# Patient Record
Sex: Female | Born: 1980 | Race: White | Hispanic: No | Marital: Married | Smoking: Never smoker
Health system: Southern US, Community
[De-identification: ages and names within clinical notes are randomized; demographics above are authoritative.]

## PROBLEM LIST (undated history)

## (undated) DIAGNOSIS — T8859XA Other complications of anesthesia, initial encounter: Secondary | ICD-10-CM

## (undated) DIAGNOSIS — R519 Headache, unspecified: Secondary | ICD-10-CM

## (undated) DIAGNOSIS — G909 Disorder of the autonomic nervous system, unspecified: Secondary | ICD-10-CM

## (undated) DIAGNOSIS — J45909 Unspecified asthma, uncomplicated: Secondary | ICD-10-CM

## (undated) DIAGNOSIS — Z86718 Personal history of other venous thrombosis and embolism: Secondary | ICD-10-CM

## (undated) DIAGNOSIS — F319 Bipolar disorder, unspecified: Secondary | ICD-10-CM

## (undated) DIAGNOSIS — R51 Headache: Secondary | ICD-10-CM

## (undated) DIAGNOSIS — R002 Palpitations: Secondary | ICD-10-CM

## (undated) DIAGNOSIS — K229 Disease of esophagus, unspecified: Secondary | ICD-10-CM

## (undated) DIAGNOSIS — S069X9A Unspecified intracranial injury with loss of consciousness of unspecified duration, initial encounter: Secondary | ICD-10-CM

## (undated) DIAGNOSIS — R112 Nausea with vomiting, unspecified: Secondary | ICD-10-CM

## (undated) DIAGNOSIS — T4145XA Adverse effect of unspecified anesthetic, initial encounter: Secondary | ICD-10-CM

## (undated) DIAGNOSIS — Z9889 Other specified postprocedural states: Secondary | ICD-10-CM

## (undated) DIAGNOSIS — I951 Orthostatic hypotension: Secondary | ICD-10-CM

## (undated) DIAGNOSIS — M3500 Sicca syndrome, unspecified: Secondary | ICD-10-CM

## (undated) DIAGNOSIS — K224 Dyskinesia of esophagus: Secondary | ICD-10-CM

## (undated) DIAGNOSIS — F445 Conversion disorder with seizures or convulsions: Secondary | ICD-10-CM

## (undated) DIAGNOSIS — G90A Postural orthostatic tachycardia syndrome (POTS): Secondary | ICD-10-CM

## (undated) DIAGNOSIS — G629 Polyneuropathy, unspecified: Secondary | ICD-10-CM

## (undated) DIAGNOSIS — I498 Other specified cardiac arrhythmias: Secondary | ICD-10-CM

## (undated) DIAGNOSIS — R000 Tachycardia, unspecified: Secondary | ICD-10-CM

## (undated) HISTORY — PX: MUSCLE BIOPSY: SHX716

---
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
## 2000-02-11 HISTORY — PX: DILATION AND CURETTAGE, DIAGNOSTIC / THERAPEUTIC: SUR384

## 2001-02-10 DIAGNOSIS — Z86718 Personal history of other venous thrombosis and embolism: Secondary | ICD-10-CM

## 2001-02-10 HISTORY — PX: CHOLECYSTECTOMY: SHX55

## 2001-02-10 HISTORY — DX: Personal history of other venous thrombosis and embolism: Z86.718

## 2006-02-02 ENCOUNTER — Ambulatory Visit: Payer: Self-pay | Admitting: *Deleted

## 2006-02-02 ENCOUNTER — Observation Stay (HOSPITAL_COMMUNITY): Admission: AD | Admit: 2006-02-02 | Discharge: 2006-02-02 | Payer: Self-pay | Admitting: Gynecology

## 2010-02-10 HISTORY — PX: DILITATION & CURRETTAGE/HYSTROSCOPY WITH HYDROTHERMAL ABLATION: SHX5570

## 2011-02-11 DIAGNOSIS — S069X9A Unspecified intracranial injury with loss of consciousness of unspecified duration, initial encounter: Secondary | ICD-10-CM

## 2011-02-11 HISTORY — DX: Unspecified intracranial injury with loss of consciousness of unspecified duration, initial encounter: S06.9X9A

## 2014-08-28 ENCOUNTER — Other Ambulatory Visit: Payer: Self-pay | Admitting: Neurology

## 2014-08-28 DIAGNOSIS — G629 Polyneuropathy, unspecified: Secondary | ICD-10-CM

## 2014-09-01 ENCOUNTER — Ambulatory Visit
Admission: RE | Admit: 2014-09-01 | Discharge: 2014-09-01 | Disposition: A | Discharge status: Home / Self Care | Payer: BLUE CROSS/BLUE SHIELD | Source: Ambulatory Visit | Attending: Neurology | Admitting: Neurology

## 2014-09-01 DIAGNOSIS — G629 Polyneuropathy, unspecified: Secondary | ICD-10-CM

## 2014-09-01 MED ORDER — GADOBENATE DIMEGLUMINE 529 MG/ML IV SOLN
15.0000 mL | Freq: Once | INTRAVENOUS | Status: AC | PRN
  Administered 2014-09-01: 15 mL via INTRAVENOUS

## 2014-10-04 ENCOUNTER — Other Ambulatory Visit: Payer: Self-pay | Admitting: Internal Medicine

## 2014-10-04 DIAGNOSIS — D3500 Benign neoplasm of unspecified adrenal gland: Secondary | ICD-10-CM

## 2014-10-06 ENCOUNTER — Other Ambulatory Visit: Payer: BLUE CROSS/BLUE SHIELD

## 2014-10-08 ENCOUNTER — Ambulatory Visit
Admission: RE | Admit: 2014-10-08 | Discharge: 2014-10-08 | Disposition: A | Discharge status: Home / Self Care | Payer: BLUE CROSS/BLUE SHIELD | Source: Ambulatory Visit | Attending: Internal Medicine | Admitting: Internal Medicine

## 2014-10-08 DIAGNOSIS — D3500 Benign neoplasm of unspecified adrenal gland: Secondary | ICD-10-CM

## 2014-10-08 MED ORDER — GADOBENATE DIMEGLUMINE 529 MG/ML IV SOLN
15.0000 mL | Freq: Once | INTRAVENOUS | Status: AC | PRN
  Administered 2014-10-08: 15 mL via INTRAVENOUS

## 2014-10-27 ENCOUNTER — Other Ambulatory Visit: Payer: Self-pay | Admitting: Internal Medicine

## 2014-10-27 DIAGNOSIS — E232 Diabetes insipidus: Secondary | ICD-10-CM

## 2014-11-18 ENCOUNTER — Ambulatory Visit
Admission: RE | Admit: 2014-11-18 | Discharge: 2014-11-18 | Disposition: A | Discharge status: Home / Self Care | Payer: BLUE CROSS/BLUE SHIELD | Source: Ambulatory Visit | Attending: Internal Medicine | Admitting: Internal Medicine

## 2014-11-18 DIAGNOSIS — E232 Diabetes insipidus: Secondary | ICD-10-CM

## 2014-11-18 MED ORDER — GADOBENATE DIMEGLUMINE 529 MG/ML IV SOLN
7.0000 mL | Freq: Once | INTRAVENOUS | Status: AC | PRN
  Administered 2014-11-18: 7 mL via INTRAVENOUS

## 2014-11-19 ENCOUNTER — Inpatient Hospital Stay: Admission: RE | Admit: 2014-11-19 | Payer: BLUE CROSS/BLUE SHIELD | Source: Ambulatory Visit

## 2015-02-11 HISTORY — PX: ESOPHAGOGASTRODUODENOSCOPY: SHX1529

## 2015-02-11 HISTORY — PX: COLONOSCOPY: SHX174

## 2016-07-03 ENCOUNTER — Ambulatory Visit: Payer: Self-pay | Admitting: Allergy and Immunology

## 2016-09-09 ENCOUNTER — Ambulatory Visit: Payer: Self-pay | Admitting: Surgery

## 2016-09-09 NOTE — H&P (Signed)
History of Present Illness Desiree Evans. Desiree Peyton MD; 09/09/2016 4:35 PM) The patient is a 36 year old female who presents with a complaint of Muscle pain. PCP - Dr. Kennith Maes Reason for referral - needs muscle biopsy  This is a 36 year old female with multiple medical issues who has been undergoing extensive workup over the last several years in search of her chronic muscle pain and weakness. She sees a Garment/textile technologist at [employer] and sees a Dietitian at [employer] [facility] [employer]. 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 A muscle biopsy is required for confirmation. The procedure was originally scheduled at [facility] by Dr. Melynda Ripple but the pathology department there did not feel that they could properly handle the specimen. She is now referred to [place] for the procedure.   Past Surgical History Dalbert Mayotte, [place]; 09/09/2016 3:52 PM) Appendectomy Gallbladder Surgery - Laparoscopic  Diagnostic Studies History Dalbert Mayotte, [place]; 09/09/2016 3:52 PM) Colonoscopy within last year Mammogram never Pap Smear 1-5 years ago  Allergies Dalbert Mayotte, CMA; 09/09/2016 3:59 PM) Latex Avocado Sulfa Drugs Imitrex *Migraine Products Est Estrogens-Methyltest *ESTROGENS* Allergies Reconciled  Medication History Dalbert Mayotte, CMA; 09/09/2016 3:56 PM) Armodafinil (150MG  Tablet, Oral) Active. Bystolic (2.5MG  Tablet, Oral) Active. Gabapentin (400MG  Capsule, Oral) Active. Hydroxychloroquine Sulfate (200MG  Tablet, Oral) Active. Pyridostigmine Bromide (60MG  Tablet, Oral) Active. Midodrine HCl (2.5MG  Tablet, Oral) Active. Armodafinil (250MG  Tablet, Oral) Active. Venlafaxine HCl ER (37.5MG  Capsule ER 24HR, Oral) Active. Pantoprazole Sodium (40MG  Tablet DR, Oral) Active. Vitamin D (Ergocalciferol) (50000UNIT Capsule, Oral) Active. HydrOXYzine HCl (25MG  Tablet, Oral) Active. Medications Reconciled  Social History Dalbert Mayotte, [place];  09/09/2016 3:52 PM) Alcohol use Occasional alcohol use. Caffeine use Carbonated beverages, Coffee, Tea. No drug use Tobacco use Never smoker.  Family History Dalbert Mayotte, [place]; 09/09/2016 3:52 PM) Arthritis Mother. Cervical Cancer Mother.  Pregnancy / Birth History Dalbert Mayotte, [place]; 09/09/2016 3:52 PM) Age at menarche 74 years. Contraceptive History Oral contraceptives. Gravida 5 Irregular periods Length (months) of breastfeeding 7-12 Maternal age 51-25 Para 65  Other Problems Dalbert Mayotte, [place]; 09/09/2016 3:52 PM) Asthma Chest pain Other disease, cancer, significant illness     Review of Systems Dalbert Mayotte CMA; 09/09/2016 3:52 PM) General Present- Fatigue. Not Present- Appetite Loss, Chills, Fever, Night Sweats, Weight Gain and Weight Loss. Skin Not Present- Change in Wart/Mole, Dryness, Hives, Jaundice, New Lesions, Non-Healing Wounds, Rash and Ulcer. HEENT Present- Hoarseness and Visual Disturbances. Not Present- Earache, Hearing Loss, Nose Bleed, Oral Ulcers, Ringing in the Ears, Seasonal Allergies, Sinus Pain, Sore Throat, Wears glasses/contact lenses and Yellow Eyes. Respiratory Present- Wheezing. Not Present- Bloody sputum, Chronic Cough, Difficulty Breathing and Snoring. Breast Not Present- Breast Mass, Breast Pain, Nipple Discharge and Skin Changes. Cardiovascular Present- Difficulty Breathing Lying Down, Leg Cramps, Rapid Heart Rate and Shortness of Breath. Not Present- Chest Pain, Palpitations and Swelling of Extremities. Gastrointestinal Present- Change in Bowel Habits, Difficulty Swallowing, Gets full quickly at meals, Indigestion and Nausea. Not Present- Abdominal Pain, Bloating, Bloody Stool, Chronic diarrhea, Constipation, Excessive gas, Hemorrhoids, Rectal Pain and Vomiting. Female Genitourinary Not Present- Frequency, Nocturia, Painful Urination, Pelvic Pain and Urgency. Musculoskeletal Present- Joint Pain, Joint Stiffness, Muscle Pain  and Muscle Weakness. Not Present- Back Pain and Swelling of Extremities. Neurological Present- Fainting, Headaches, Trouble walking and Weakness. Not Present- Decreased Memory, Numbness, Seizures, Tingling and Tremor. Psychiatric Not Present- Anxiety, Bipolar, Change in Sleep Pattern, Depression, Fearful and Frequent crying. Endocrine Not Present- Cold Intolerance, Excessive Hunger, Hair Changes, Heat Intolerance, Hot flashes  and New Diabetes. Hematology Present- Easy Bruising. Not Present- Blood Thinners, Excessive bleeding, Gland problems, HIV and Persistent Infections.  Vitals Dalbert Mayotte CMA; 09/09/2016 3:59 PM) 09/09/2016 3:59 PM Weight: 181.4 lb Height: 67in Body Surface Area: 1.94 m Body Mass Index: 28.41 kg/m  Temp.: 97.38F  Pulse: 78 (Regular)  BP: 110/72 (Sitting, Left Arm, Standard)      Physical Exam Rodman Key K. Nathaly Dawkins MD; 09/09/2016 4:38 PM)  The physical exam findings are as follows: Note:WDWN in NAD Eyes: Pupils equal, round; sclera anicteric HENT: Oral mucosa moist; good dentition Neck: No masses palpated, no thyromegaly Lungs: CTA bilaterally; normal respiratory effort CV: Regular rate and rhythm; no murmurs; extremities well-perfused with no edema Abd: +bowel sounds, soft, non-tender, no palpable organomegaly; no palpable hernias Skin: Warm, dry; no sign of jaundice Right anterior thigh - no scars; no palpable masses; moderately tender to palpation Psychiatric - alert and oriented x 4; calm mood and affect    Assessment & Plan Rodman Key K. 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 The surgical procedure has been discussed with the patient. Potential risks, benefits, alternative treatments, and expected outcomes have been explained. All of the patient's questions at this time have been answered. The likelihood of reaching the patient's treatment goal is good. The  patient understand the proposed surgical procedure and wishes to proceed. Note:I explained to the patient and her husband that I would only be the technician for this procedure. I will not be any help in interpreting the results. We will forward the results her primary care physician for further treatment. We will recheck her wound after surgery.  The patient is out of town for the next week and a hal and start a new job on August 20. We'll try to schedule this on Monday, August 13th.  Desiree Evans. Georgette Dover, MD, [facility] Surgery  General/ Trauma Surgery  09/09/2016 4:39 PM

## 2016-09-19 ENCOUNTER — Encounter (HOSPITAL_COMMUNITY): Payer: Self-pay | Admitting: *Deleted

## 2016-09-19 MED ORDER — CEFAZOLIN SODIUM-DEXTROSE 2-4 GM/100ML-% IV SOLN
2.0000 g | INTRAVENOUS | Status: AC
  Administered 2016-09-22: 2 g via INTRAVENOUS
  Filled 2016-09-19: qty 100

## 2016-09-19 NOTE — Progress Notes (Signed)
Anesthesia Chart Review:  Pt is a same day work up.   Pt is a 36 year old female scheduled for R thigh muscle biopsy as part of work up for muscular dystrophy on 09/22/2016 with Donnie Mesa, MD  - PCP is Kennith Maes, MD - Cardiologist is Donnamae Jude, MD  - EP cardiologist is Mylinda Latina, MD  - Neurologist is Roque Cash, MD   - Allergist is Ashby Dawes, MD   - Rheumatologist is Evette Cristal, MD   - GI is Laureen Abrahams, PA and Bobbye Morton, MD  (Notes for all providers except PCP are in care everywhere)  PMH includes:  POTS, syncope, frequent falls, hypersomnolence, chronic idiopathic urticaria, Sjogren's syndrome, bipolar disorder, jackhammer esophagus (s/p botox injections)  Medications include: nuvigil, B complex, Zyrtec, Flonase, Neurontin, Atarax, ibuprofen-famotidine, midodrine, mometasone-formoterol, Mestinon, nebivolol, Protonix, MiraLAX, armor thyroid, Effexor, vitamin D, albuterol, Zantac - Mestinon is prescribed by GI for 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 Pt also reported to PAT RN she takes it when her BP is too low.  Pt will hold mestinon DOS if she feels she is able, but will take it if her BP that day is too low.   Labs will be obtained DOS  CXR 06/10/16 (care everywhere):  1. Cardiac silhouette and mediastinal contours are within normal limits. 2. Lungs and pleural spaces and visualized bones are within normal limits  EKG will be obtained DOS  Holter monitor 09/10/15 (care everywhere):  1) The quality of this 48 hour Holter was good. 2) The patient was in sinus rhythm, sinus bradycardia, sinus tachycardia and sinus arrhythmia.  3) Ventricular ectopic activity consisted of multifocal PVCs(0.002% of total beats) (strip 6).  4) Supraventricular ectopic activity consisted of PACs (0.002% of total beats) (strip 8).  5) There were no pauses greater than 2.0 seconds noted.  6) Symptoms of "chest pain, ringing in ears and  headache" correlated with sinus rhythm and sinus tachycardia (strips 5,7, and 9).  If labs and EKG acceptable DOS, I anticipate pt can proceed as scheduled.   Willeen Cass, FNP-BC [facility]/Anesthesiology Phone: [phone] 09/19/2016 3:55 PM

## 2016-09-19 NOTE — Progress Notes (Signed)
Mrs Feuerborn reports that she takes Mestinon for gastric motility and for hypotension.  Patient reports that CBG can be in the 70's when she just take MIdodrine.  I spoke with Willeen Cass, NP and was instructed to tell patient that it is her call, patient said she may not need it, but will take if blood pressure is low.

## 2016-09-22 ENCOUNTER — Ambulatory Visit (HOSPITAL_COMMUNITY): Payer: 59 | Admitting: Emergency Medicine

## 2016-09-22 ENCOUNTER — Ambulatory Visit (HOSPITAL_COMMUNITY)
Admission: RE | Admit: 2016-09-22 | Discharge: 2016-09-22 | Disposition: A | Discharge status: Home / Self Care | Payer: 59 | Source: Ambulatory Visit | Attending: Surgery | Admitting: Surgery

## 2016-09-22 ENCOUNTER — Encounter (HOSPITAL_COMMUNITY)
Admission: RE | Disposition: A | Discharge status: Home / Self Care | Payer: Self-pay | Source: Ambulatory Visit | Attending: Surgery

## 2016-09-22 ENCOUNTER — Encounter (HOSPITAL_COMMUNITY): Payer: Self-pay | Admitting: Surgery

## 2016-09-22 DIAGNOSIS — M3500 Sicca syndrome, unspecified: Secondary | ICD-10-CM | POA: Insufficient documentation

## 2016-09-22 DIAGNOSIS — G8929 Other chronic pain: Secondary | ICD-10-CM | POA: Insufficient documentation

## 2016-09-22 DIAGNOSIS — M6281 Muscle weakness (generalized): Secondary | ICD-10-CM | POA: Diagnosis not present

## 2016-09-22 DIAGNOSIS — Z79899 Other long term (current) drug therapy: Secondary | ICD-10-CM | POA: Diagnosis not present

## 2016-09-22 DIAGNOSIS — I498 Other specified cardiac arrhythmias: Secondary | ICD-10-CM | POA: Insufficient documentation

## 2016-09-22 DIAGNOSIS — J45909 Unspecified asthma, uncomplicated: Secondary | ICD-10-CM | POA: Diagnosis not present

## 2016-09-22 DIAGNOSIS — Z9181 History of falling: Secondary | ICD-10-CM | POA: Diagnosis not present

## 2016-09-22 DIAGNOSIS — K5900 Constipation, unspecified: Secondary | ICD-10-CM | POA: Diagnosis not present

## 2016-09-22 DIAGNOSIS — E039 Hypothyroidism, unspecified: Secondary | ICD-10-CM | POA: Diagnosis not present

## 2016-09-22 DIAGNOSIS — G4710 Hypersomnia, unspecified: Secondary | ICD-10-CM | POA: Diagnosis not present

## 2016-09-22 HISTORY — DX: Conversion disorder with seizures or convulsions: F44.5

## 2016-09-22 HISTORY — DX: Palpitations: R00.2

## 2016-09-22 HISTORY — DX: Postural orthostatic tachycardia syndrome (POTS): G90.A

## 2016-09-22 HISTORY — DX: Headache: R51

## 2016-09-22 HISTORY — DX: Unspecified intracranial injury with loss of consciousness of unspecified duration, initial encounter: S06.9X9A

## 2016-09-22 HISTORY — DX: Orthostatic hypotension: I95.1

## 2016-09-22 HISTORY — DX: Disorder of the autonomic nervous system, unspecified: G90.9

## 2016-09-22 HISTORY — DX: Other specified postprocedural states: Z98.890

## 2016-09-22 HISTORY — DX: Other specified cardiac arrhythmias: I49.8

## 2016-09-22 HISTORY — DX: Dyskinesia of esophagus: K22.4

## 2016-09-22 HISTORY — DX: Tachycardia, unspecified: R00.0

## 2016-09-22 HISTORY — DX: Nausea with vomiting, unspecified: R11.2

## 2016-09-22 HISTORY — DX: Polyneuropathy, unspecified: G62.9

## 2016-09-22 HISTORY — DX: Sjogren syndrome, unspecified: M35.00

## 2016-09-22 HISTORY — DX: Adverse effect of unspecified anesthetic, initial encounter: T41.45XA

## 2016-09-22 HISTORY — DX: Personal history of other venous thrombosis and embolism: Z86.718

## 2016-09-22 HISTORY — DX: Other complications of anesthesia, initial encounter: T88.59XA

## 2016-09-22 HISTORY — DX: Headache, unspecified: R51.9

## 2016-09-22 HISTORY — DX: Disease of esophagus, unspecified: K22.9

## 2016-09-22 HISTORY — DX: Unspecified asthma, uncomplicated: J45.909

## 2016-09-22 HISTORY — DX: Bipolar disorder, unspecified: F31.9

## 2016-09-22 HISTORY — PX: MUSCLE BIOPSY: SHX716

## 2016-09-22 LAB — BASIC METABOLIC PANEL
ANION GAP: 8 (ref 5–15)
BUN: 13 mg/dL (ref 6–20)
CALCIUM: 8.7 mg/dL — AB (ref 8.9–10.3)
CHLORIDE: 104 mmol/L (ref 101–111)
CO2: 26 mmol/L (ref 22–32)
Creatinine, Ser: 0.86 mg/dL (ref 0.44–1.00)
GFR calc non Af Amer: >60 mL/min (ref >60)
Glucose, Bld: 96 mg/dL (ref 65–99)
Potassium: 3.6 mmol/L (ref 3.5–5.1)
Sodium: 138 mmol/L (ref 135–145)

## 2016-09-22 LAB — HCG, SERUM, QUALITATIVE: Preg, Serum: NEGATIVE

## 2016-09-22 LAB — CBC
HEMATOCRIT: 38.8 % (ref 36.0–46.0)
HEMOGLOBIN: 13.3 g/dL (ref 12.0–15.0)
MCH: 30.1 pg (ref 26.0–34.0)
MCHC: 34.3 g/dL (ref 30.0–36.0)
MCV: 87.8 fL (ref 78.0–100.0)
Platelets: 226 10*3/uL (ref 150–400)
RBC: 4.42 MIL/uL (ref 3.87–5.11)
RDW: 12.2 % (ref 11.5–15.5)
WBC: 6.4 10*3/uL (ref 4.0–10.5)

## 2016-09-22 SURGERY — MUSCLE BIOPSY
Anesthesia: Monitor Anesthesia Care | Site: Leg Upper | Laterality: Right

## 2016-09-22 MED ORDER — FENTANYL CITRATE (PF) 100 MCG/2ML IJ SOLN
INTRAMUSCULAR | Status: DC | PRN
  Administered 2016-09-22 (×3): 25 ug via INTRAVENOUS

## 2016-09-22 MED ORDER — FENTANYL CITRATE (PF) 250 MCG/5ML IJ SOLN
INTRAMUSCULAR | Status: AC
  Filled 2016-09-22: qty 5

## 2016-09-22 MED ORDER — CHLORHEXIDINE GLUCONATE CLOTH 2 % EX PADS: 6.0000 | MEDICATED_PAD | Freq: Once | CUTANEOUS | Status: DC

## 2016-09-22 MED ORDER — HYDROMORPHONE HCL 1 MG/ML IJ SOLN: 0.2500 mg | INTRAMUSCULAR | Status: DC | PRN

## 2016-09-22 MED ORDER — MIDAZOLAM HCL 2 MG/2ML IJ SOLN
INTRAMUSCULAR | Status: AC
  Filled 2016-09-22: qty 2

## 2016-09-22 MED ORDER — PROPOFOL 1000 MG/100ML IV EMUL
INTRAVENOUS | Status: AC
  Filled 2016-09-22: qty 100

## 2016-09-22 MED ORDER — OXYCODONE HCL 5 MG/5ML PO SOLN: 5.0000 mg | Freq: Once | ORAL | Status: DC | PRN

## 2016-09-22 MED ORDER — PROPOFOL 500 MG/50ML IV EMUL
INTRAVENOUS | Status: DC | PRN
  Administered 2016-09-22: 75 ug/kg/min via INTRAVENOUS

## 2016-09-22 MED ORDER — PROMETHAZINE HCL 25 MG/ML IJ SOLN: 6.2500 mg | INTRAMUSCULAR | Status: DC | PRN

## 2016-09-22 MED ORDER — ONDANSETRON HCL 4 MG/2ML IJ SOLN
INTRAMUSCULAR | Status: DC | PRN
  Administered 2016-09-22: 4 mg via INTRAVENOUS

## 2016-09-22 MED ORDER — OXYCODONE HCL 5 MG PO TABS: 5.0000 mg | ORAL_TABLET | Freq: Once | ORAL | Status: DC | PRN

## 2016-09-22 MED ORDER — BUPIVACAINE-EPINEPHRINE (PF) 0.25% -1:200000 IJ SOLN
INTRAMUSCULAR | Status: AC
  Filled 2016-09-22: qty 30

## 2016-09-22 MED ORDER — DEXAMETHASONE SODIUM PHOSPHATE 10 MG/ML IJ SOLN
INTRAMUSCULAR | Status: DC | PRN
  Administered 2016-09-22: 10 mg via INTRAVENOUS

## 2016-09-22 MED ORDER — LIDOCAINE HCL (CARDIAC) 20 MG/ML IV SOLN
INTRAVENOUS | Status: DC | PRN
  Administered 2016-09-22: 50 mg via INTRATRACHEAL

## 2016-09-22 MED ORDER — HYDROCODONE-ACETAMINOPHEN 5-325 MG PO TABS: 1.0000 | ORAL_TABLET | Freq: Four times a day (QID) | ORAL | 0 refills | Status: DC | PRN

## 2016-09-22 MED ORDER — BUPIVACAINE-EPINEPHRINE 0.25% -1:200000 IJ SOLN
INTRAMUSCULAR | Status: DC | PRN
  Administered 2016-09-22: 20 mL

## 2016-09-22 MED ORDER — MIDAZOLAM HCL 5 MG/5ML IJ SOLN
INTRAMUSCULAR | Status: DC | PRN
  Administered 2016-09-22: 2 mg via INTRAVENOUS

## 2016-09-22 MED ORDER — LACTATED RINGERS IV SOLN
INTRAVENOUS | Status: DC | PRN
  Administered 2016-09-22: 07:00:00 via INTRAVENOUS

## 2016-09-22 MED ORDER — 0.9 % SODIUM CHLORIDE (POUR BTL) OPTIME
TOPICAL | Status: DC | PRN
  Administered 2016-09-22: 1000 mL

## 2016-09-22 MED ORDER — PROPOFOL 10 MG/ML IV BOLUS
INTRAVENOUS | Status: AC
  Filled 2016-09-22: qty 40

## 2016-09-22 SURGICAL SUPPLY — 43 items
ADH SKN CLS APL DERMABOND .7 (GAUZE/BANDAGES/DRESSINGS) ×1
APL SKNCLS STERI-STRIP NONHPOA (GAUZE/BANDAGES/DRESSINGS) ×1
BENZOIN TINCTURE PRP APPL 2/3 (GAUZE/BANDAGES/DRESSINGS) ×1 IMPLANT
CANISTER SUCT 3000ML PPV (MISCELLANEOUS) IMPLANT
CHLORAPREP W/TINT 10.5 ML (MISCELLANEOUS) ×2 IMPLANT
CHLORAPREP W/TINT 26ML (MISCELLANEOUS) ×2 IMPLANT
CLSR STERI-STRIP ANTIMIC 1/2X4 (GAUZE/BANDAGES/DRESSINGS) ×1 IMPLANT
CONT SPEC 4OZ CLIKSEAL STRL BL (MISCELLANEOUS) ×2 IMPLANT
COVER SURGICAL LIGHT HANDLE (MISCELLANEOUS) ×2 IMPLANT
DERMABOND ADVANCED (GAUZE/BANDAGES/DRESSINGS) ×1
DERMABOND ADVANCED .7 DNX12 (GAUZE/BANDAGES/DRESSINGS) ×1 IMPLANT
DRAPE LAPAROTOMY 100X72 PEDS (DRAPES) ×2 IMPLANT
DRAPE UTILITY XL STRL (DRAPES) ×4 IMPLANT
DRSG TEGADERM 4X4.75 (GAUZE/BANDAGES/DRESSINGS) ×1 IMPLANT
DRSG TELFA 3X8 NADH (GAUZE/BANDAGES/DRESSINGS) ×2 IMPLANT
ELECT CAUTERY BLADE 6.4 (BLADE) ×2 IMPLANT
ELECT REM PT RETURN 9FT ADLT (ELECTROSURGICAL) ×2
ELECTRODE REM PT RTRN 9FT ADLT (ELECTROSURGICAL) ×1 IMPLANT
GAUZE SPONGE 2X2 8PLY NS (GAUZE/BANDAGES/DRESSINGS) ×1 IMPLANT
GAUZE SPONGE 4X4 16PLY XRAY LF (GAUZE/BANDAGES/DRESSINGS) ×2 IMPLANT
GLOVE BIO SURGEON STRL SZ7 (GLOVE) ×2 IMPLANT
GLOVE BIOGEL PI IND STRL 7.5 (GLOVE) ×1 IMPLANT
GLOVE BIOGEL PI INDICATOR 7.5 (GLOVE) ×1
GOWN STRL REUS W/ TWL LRG LVL3 (GOWN DISPOSABLE) ×2 IMPLANT
GOWN STRL REUS W/TWL LRG LVL3 (GOWN DISPOSABLE) ×4
KIT BASIN OR (CUSTOM PROCEDURE TRAY) ×2 IMPLANT
KIT ROOM TURNOVER OR (KITS) ×2 IMPLANT
NDL HYPO 25GX1X1/2 BEV (NEEDLE) ×1 IMPLANT
NEEDLE HYPO 25GX1X1/2 BEV (NEEDLE) ×2 IMPLANT
NS IRRIG 1000ML POUR BTL (IV SOLUTION) ×2 IMPLANT
PACK SURGICAL SETUP 50X90 (CUSTOM PROCEDURE TRAY) ×2 IMPLANT
PAD ARMBOARD 7.5X6 YLW CONV (MISCELLANEOUS) ×4 IMPLANT
PAD DRESSING TELFA 3X8 NADH (GAUZE/BANDAGES/DRESSINGS) ×1 IMPLANT
PENCIL BUTTON HOLSTER BLD 10FT (ELECTRODE) ×2 IMPLANT
SUT MON AB 4-0 PC3 18 (SUTURE) ×2 IMPLANT
SUT VIC AB 3-0 SH 18 (SUTURE) ×2 IMPLANT
SYR BULB 3OZ (MISCELLANEOUS) ×2 IMPLANT
SYR CONTROL 10ML LL (SYRINGE) ×2 IMPLANT
TOWEL OR 17X24 6PK STRL BLUE (TOWEL DISPOSABLE) ×2 IMPLANT
TOWEL OR 17X26 10 PK STRL BLUE (TOWEL DISPOSABLE) ×2 IMPLANT
TUBE CONNECTING 12X1/4 (SUCTIONS) IMPLANT
WATER STERILE IRR 1000ML POUR (IV SOLUTION) IMPLANT
YANKAUER SUCT BULB TIP NO VENT (SUCTIONS) IMPLANT

## 2016-09-22 NOTE — H&P (View-Only) (Signed)
History of Present Illness Desiree Evans. Raina Sole MD; 09/09/2016 4:35 PM) The patient is a 36 year old female who presents with a complaint of Muscle pain. PCP - Dr. Kennith Maes Reason for referral - needs muscle biopsy  This is a 36 year old female with multiple medical issues who has been undergoing extensive workup over the last several years in search of her chronic muscle pain and weakness. She sees a Garment/textile technologist at [employer] and sees a Dietitian at [employer]. 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 A muscle biopsy is required for confirmation. The procedure was originally scheduled at [facility] by Dr. Melynda Ripple but the pathology department there did not feel that they could properly handle the specimen. She is now referred to [place] for the procedure.   Past Surgical History Dalbert Mayotte, [place]; 09/09/2016 3:52 PM) Appendectomy Gallbladder Surgery - Laparoscopic  Diagnostic Studies History Dalbert Mayotte, [place]; 09/09/2016 3:52 PM) Colonoscopy within last year Mammogram never Pap Smear 1-5 years ago  Allergies Dalbert Mayotte, CMA; 09/09/2016 3:59 PM) Latex Avocado Sulfa Drugs Imitrex *Migraine Products Est Estrogens-Methyltest *ESTROGENS* Allergies Reconciled  Medication History Dalbert Mayotte, CMA; 09/09/2016 3:56 PM) Armodafinil (150MG  Tablet, Oral) Active. Bystolic (2.5MG  Tablet, Oral) Active. Gabapentin (400MG  Capsule, Oral) Active. Hydroxychloroquine Sulfate (200MG  Tablet, Oral) Active. Pyridostigmine Bromide (60MG  Tablet, Oral) Active. Midodrine HCl (2.5MG  Tablet, Oral) Active. Armodafinil (250MG  Tablet, Oral) Active. Venlafaxine HCl ER (37.5MG  Capsule ER 24HR, Oral) Active. Pantoprazole Sodium (40MG  Tablet DR, Oral) Active. Vitamin D (Ergocalciferol) (50000UNIT Capsule, Oral) Active. HydrOXYzine HCl (25MG  Tablet, Oral) Active. Medications Reconciled  Social History Dalbert Mayotte, [place];  09/09/2016 3:52 PM) Alcohol use Occasional alcohol use. Caffeine use Carbonated beverages, Coffee, Tea. No drug use Tobacco use Never smoker.  Family History Dalbert Mayotte, [place]; 09/09/2016 3:52 PM) Arthritis Mother. Cervical Cancer Mother.  Pregnancy / Birth History Dalbert Mayotte, [place]; 09/09/2016 3:52 PM) Age at menarche 35 years. Contraceptive History Oral contraceptives. Gravida 5 Irregular periods Length (months) of breastfeeding 7-12 Maternal age 81-25 Para 84  Other Problems Dalbert Mayotte, [place]; 09/09/2016 3:52 PM) Asthma Chest pain Other disease, cancer, significant illness     Review of Systems Dalbert Mayotte CMA; 09/09/2016 3:52 PM) General Present- Fatigue. Not Present- Appetite Loss, Chills, Fever, Night Sweats, Weight Gain and Weight Loss. Skin Not Present- Change in Wart/Mole, Dryness, Hives, Jaundice, New Lesions, Non-Healing Wounds, Rash and Ulcer. HEENT Present- Hoarseness and Visual Disturbances. Not Present- Earache, Hearing Loss, Nose Bleed, Oral Ulcers, Ringing in the Ears, Seasonal Allergies, Sinus Pain, Sore Throat, Wears glasses/contact lenses and Yellow Eyes. Respiratory Present- Wheezing. Not Present- Bloody sputum, Chronic Cough, Difficulty Breathing and Snoring. Breast Not Present- Breast Mass, Breast Pain, Nipple Discharge and Skin Changes. Cardiovascular Present- Difficulty Breathing Lying Down, Leg Cramps, Rapid Heart Rate and Shortness of Breath. Not Present- Chest Pain, Palpitations and Swelling of Extremities. Gastrointestinal Present- Change in Bowel Habits, Difficulty Swallowing, Gets full quickly at meals, Indigestion and Nausea. Not Present- Abdominal Pain, Bloating, Bloody Stool, Chronic diarrhea, Constipation, Excessive gas, Hemorrhoids, Rectal Pain and Vomiting. Female Genitourinary Not Present- Frequency, Nocturia, Painful Urination, Pelvic Pain and Urgency. Musculoskeletal Present- Joint Pain, Joint Stiffness, Muscle Pain  and Muscle Weakness. Not Present- Back Pain and Swelling of Extremities. Neurological Present- Fainting, Headaches, Trouble walking and Weakness. Not Present- Decreased Memory, Numbness, Seizures, Tingling and Tremor. Psychiatric Not Present- Anxiety, Bipolar, Change in Sleep Pattern, Depression, Fearful and Frequent crying. Endocrine Not Present- Cold Intolerance, Excessive Hunger, Hair Changes, Heat Intolerance, Hot flashes  and New Diabetes. Hematology Present- Easy Bruising. Not Present- Blood Thinners, Excessive bleeding, Gland problems, HIV and Persistent Infections.  Vitals Dalbert Mayotte CMA; 09/09/2016 3:59 PM) 09/09/2016 3:59 PM Weight: 181.4 lb Height: 67in Body Surface Area: 1.94 m Body Mass Index: 28.41 kg/m  Temp.: 97.44F  Pulse: 78 (Regular)  BP: 110/72 (Sitting, Left Arm, Standard)      Physical Exam Rodman Key K. Avonell Lenig MD; 09/09/2016 4:38 PM)  The physical exam findings are as follows: Note:WDWN in NAD Eyes: Pupils equal, round; sclera anicteric HENT: Oral mucosa moist; good dentition Neck: No masses palpated, no thyromegaly Lungs: CTA bilaterally; normal respiratory effort CV: Regular rate and rhythm; no murmurs; extremities well-perfused with no edema Abd: +bowel sounds, soft, non-tender, no palpable organomegaly; no palpable hernias Skin: Warm, dry; no sign of jaundice Right anterior thigh - no scars; no palpable masses; moderately tender to palpation Psychiatric - alert and oriented x 4; calm mood and affect    Assessment & Plan Rodman Key K. 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 The surgical procedure has been discussed with the patient. Potential risks, benefits, alternative treatments, and expected outcomes have been explained. All of the patient's questions at this time have been answered. The likelihood of reaching the patient's treatment goal is good. The  patient understand the proposed surgical procedure and wishes to proceed. Note:I explained to the patient and her husband that I would only be the technician for this procedure. I will not be any help in interpreting the results. We will forward the results her primary care physician for further treatment. We will recheck her wound after surgery.  The patient is out of town for the next week and a hal and start a new job on August 20. We'll try to schedule this on Monday, August 13th.  Desiree Evans. Georgette Dover, MD, [facility]  General/ Trauma Surgery  09/09/2016 4:39 PM

## 2016-09-22 NOTE — Transfer of Care (Signed)
Immediate Anesthesia Transfer of Care Note  Patient: Desiree Evans  Procedure(s) Performed: Procedure(s): RIGHT THIGH MUSCLE BIOPSY (Right)  Patient Location: PACU  Anesthesia Type:MAC  Level of Consciousness: awake, alert , oriented, patient cooperative and responds to stimulation  Airway & Oxygen Therapy: Patient Spontanous Breathing and Patient connected to face mask oxygen  Post-op Assessment: Report given to RN, Post -op Vital signs reviewed and stable and Patient moving all extremities X 4  Post vital signs: Reviewed and stable  Last Vitals:  Vitals:   09/22/16 0608 09/22/16 0828  BP: 126/78   Pulse: 69   Resp: 18   Temp: 36.4 C (P) 36.6 C  SpO2: 100%     Last Pain:  Vitals:   09/22/16 0620  PainSc: 4       Patients Stated Pain Goal: 4 (79/43/27 [id number])  Complications: No apparent anesthesia complications

## 2016-09-22 NOTE — Discharge Instructions (Signed)
[facility]   POST OP INSTRUCTIONS  Always review your discharge instruction sheet given to you by the facility where your surgery was performed. IF YOU HAVE DISABILITY OR FAMILY LEAVE FORMS, YOU MUST BRING THEM TO THE OFFICE FOR PROCESSING.   DO NOT GIVE THEM TO YOUR DOCTOR.  1. A  prescription for pain medication may be given to you upon discharge.  Take your pain medication as prescribed, if needed.  If narcotic pain medicine is not needed, then you may take acetaminophen (Tylenol) or ibuprofen (Advil) as needed. 2. Take your usually prescribed medications unless otherwise directed. 3. If you need a refill on your pain medication, please contact your pharmacy.  They will contact our office to request authorization. Prescriptions will not be filled after 5 pm or on week-ends. 4. You should follow a light diet the first 24 hours after arrival home, such as soup and crackers, etc.  Be sure to include lots of fluids daily.  Resume your normal diet the day after surgery. 5. Most patients will experience some swelling and bruising around the incision.  Ice packs will help.  Swelling and bruising can take several days to resolve.  6. It is common to experience some constipation if taking pain medication after surgery.  Increasing fluid intake and taking a stool softener (such as Colace) will usually help or prevent this problem from occurring.  A mild laxative (Milk of Magnesia or Miralax) should be taken according to package directions if there are no bowel movements after 48 hours. 7. Unless discharge instructions indicate otherwise, you may remove your bandages 48 hours after surgery, and you may shower at that time.  You will have steri-strips (small skin tapes) in place directly over the incision.  These strips should be left on the skin for 7-10 days. 8. ACTIVITIES:  You may resume regular (light) daily activities beginning the next day--such as daily self-care, walking, climbing  stairs--gradually increasing activities as tolerated.  You may have sexual intercourse when it is comfortable.   a. You may drive when you are no longer taking prescription pain medication, you can comfortably wear a seatbelt, and you can safely maneuver your car and apply brakes. b. RETURN TO WORK:  Resume normal activity when soreness has resolved 9. You should see your doctor in the office as needed.  Your pathology report will be forwarded to your doctor.  Make sure that you call for this appointment within a day or two after you arrive home to insure a convenient appointment time. 10. OTHER INSTRUCTIONS:  __________________________________________________________________________________________________________________________________________________________________________________________  WHEN TO CALL YOUR DOCTOR: 1. Fever over 101.0 2. Inability to urinate 3. Nausea and/or vomiting 4. Extreme swelling or bruising 5. Continued bleeding from incision. 6. Increased pain, redness, or drainage from the incision  The clinic staff is available to answer your questions during regular business hours.  Please dont hesitate to call and ask to speak to one of the nurses for clinical concerns.  If you have a medical emergency, go to the nearest emergency room or call 911.  A surgeon from [facility] is always on call at the hospital   [address], [place], [place], [place]  [address] ?  [address], [place],    [address] [phone]    FAX [phone] Web site: [email or web address]

## 2016-09-22 NOTE — Anesthesia Preprocedure Evaluation (Addendum)
Anesthesia Evaluation  Patient identified by MRN, date of birth, ID band Patient awake    Reviewed: Allergy & Precautions, NPO status , Patient's Chart, lab work & pertinent test results, reviewed documented beta blocker date and time   History of Anesthesia Complications (+) PONV and history of anesthetic complications  Airway Mallampati: II  TM Distance: >3 FB Neck ROM: Full    Dental no notable dental hx.    Pulmonary asthma (controlled) ,    Pulmonary exam normal breath sounds clear to auscultation       Cardiovascular negative cardio ROS Normal cardiovascular exam Rhythm:Regular Rate:Normal  Sees cardiologist at [place]   Neuro/Psych  Headaches, PSYCHIATRIC DISORDERS Bipolar Disorder Muscular dystrophy  Neuromuscular disease    GI/Hepatic Neg liver ROS, Jackhammer esophagus   Endo/Other  Hypothyroidism   Renal/GU negative Renal ROS     Musculoskeletal negative musculoskeletal ROS (+)   Abdominal   Peds  Hematology negative hematology ROS (+)   Anesthesia Other Findings   Reproductive/Obstetrics                            Anesthesia Physical Anesthesia Plan  ASA: III  Anesthesia Plan: MAC   Post-op Pain Management:    Induction: Intravenous  PONV Risk Score and Plan: 3 and Ondansetron, Dexamethasone, Midazolam and Propofol infusion  Airway Management Planned: Simple Face Mask  Additional Equipment:   Intra-op Plan:   Post-operative Plan:   Informed Consent: I have reviewed the patients History and Physical, chart, labs and discussed the procedure including the risks, benefits and alternatives for the proposed anesthesia with the patient or authorized representative who has indicated his/her understanding and acceptance.   Dental advisory given  Plan Discussed with: CRNA  Anesthesia Plan Comments:        Anesthesia Quick Evaluation

## 2016-09-22 NOTE — Anesthesia Postprocedure Evaluation (Signed)
Anesthesia Post Note  Patient: Desiree Evans  Procedure(s) Performed: Procedure(s) (LRB): RIGHT THIGH MUSCLE BIOPSY (Right)     Patient location during evaluation: PACU Anesthesia Type: MAC Level of consciousness: awake and alert Pain management: pain level controlled Vital Signs Assessment: post-procedure vital signs reviewed and stable Respiratory status: spontaneous breathing, nonlabored ventilation, respiratory function stable and patient connected to nasal cannula oxygen Cardiovascular status: stable and blood pressure returned to baseline Anesthetic complications: no    Last Vitals:  Vitals:   09/22/16 0915 09/22/16 0922  BP: 121/87 101/89  Pulse: 70 72  Resp: 14 14  Temp:  (!) 36.2 C  SpO2: 100% 100%    Last Pain:  Vitals:   09/22/16 0620  PainSc: 4                  Ryan P Ellender

## 2016-09-22 NOTE — Op Note (Signed)
Preop diagnosis:  Muscular dystrophy Postop diagnosis: Same Procedure performed: Right thigh muscle biopsy (vastus lateralis) Surgeon:Treshawn Allen K. Anesthesia: Local Mac Indications: This is a 36 year old female who is referred by her neurologist and primary care physician for right thigh muscle biopsy.  Description of procedure: The patient brought to the operating room placed in supine position on the operating table. After an adequate level of intravenous sedation was given, her right thigh was prepped with ChloraPrep and draped in sterile fashion. A timeout was taken to ensure the proper patient and proper procedure. We infiltrated the skin and subcutaneous tissues of the right thigh with 0.25% Marcaine with epinephrine. A vertical incision was made measuring about 3.5 cm. We dissected down the subcutaneous tissues to the fascia of the vastus lateralis. We incise this vertically. We dissected around a some segment of muscle measuring 3 cm long and 1 cm wide. We excised this with Metzenbaum scissors. This was placed on Telfa gauze and sent immediately to the lab for pathologic examination. We inspected one for hemostasis. The fascia was closed with 3-0 Vicryl. 3-0 Vicryl is used to close subcutaneous tissues of 4-0 Monocryl were used to close the skin. Benzoin and Steri-Strips were applied.  The patient was then awakened and brought to the recovery room in stable condition. All sponge, instrument, and needle counts are correct.  Desiree Evans. Georgette Dover, MD, [facility] Surgery  General/ Trauma Surgery  09/22/2016 8:29 AM

## 2016-09-22 NOTE — Interval H&P Note (Signed)
History and Physical Interval Note:  09/22/2016 6:58 AM  Desiree Evans  has presented today for surgery, with the diagnosis of Muscular dystrophy  The various methods of treatment have been discussed with the patient and family. After consideration of risks, benefits and other options for treatment, the patient has consented to  Procedure(s): RIGHT THIGH MUSCLE BIOPSY (Right) as a surgical intervention .  The patient's history has been reviewed, patient examined, no change in status, stable for surgery.  I have reviewed the patient's chart and labs.  Questions were answered to the patient's satisfaction.     [name]

## 2016-09-23 ENCOUNTER — Encounter (HOSPITAL_COMMUNITY): Payer: Self-pay | Admitting: Surgery

## 2016-10-02 ENCOUNTER — Encounter (HOSPITAL_COMMUNITY): Payer: Self-pay

## 2017-02-01 IMAGING — MR MR HEAD WO/W CM
15 of 19 series · 33 of 48 positions shown · IV contrast (multihance)
Comparison: 04/05/2013

CLINICAL DATA: Diabetes insipidus. Increased thirst and hoarseness
for 3 years. Abnormal thyroid levels. Peripheral neuropathy.
Difficulty walking and talking. Confusion, depression, numbness, and
weakness.

EXAM:
MRI HEAD WITHOUT AND WITH CONTRAST
TECHNIQUE: Multiplanar, multiecho pulse sequences of the brain and surrounding
structures were obtained without and with intravenous contrast.
CONTRAST:  7mL MULTIHANCE GADOBENATE DIMEGLUMINE 529 MG/ML IV SOLN

[Series 2: T1 · sagittal · 5.0mm · 0.45mm/px · 1 of 21 slices shown]
[im 1/21]
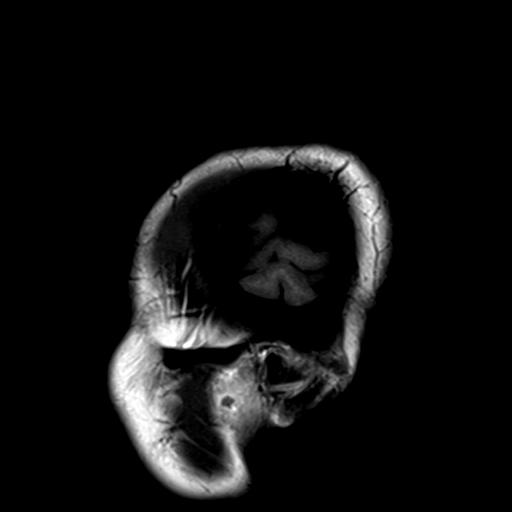

[Series 3: DWI · axial · 3.0mm · 1.80mm/px · z∈[-47,+99]mm · 8 of 100 slices shown]
[im 1/100]
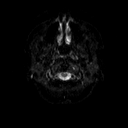
[im 12/100]
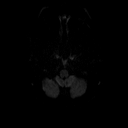
[im 34/100]
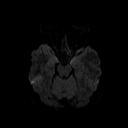
[im 45/100]
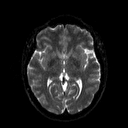
[im 56/100]
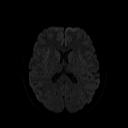
[im 67/100]
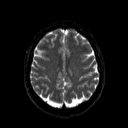
[im 89/100]
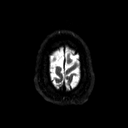
[im 100/100]
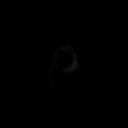

[Series 4: dwi_adc · axial · 3.0mm · 1.80mm/px · z∈[-47,+99]mm · 6 of 50 slices shown]
[im 1/50]
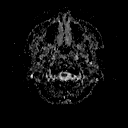
[im 10/50]
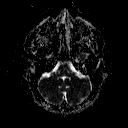
[im 20/50]
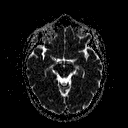
[im 30/50]
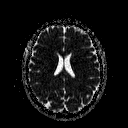
[im 40/50]
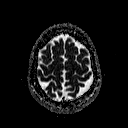
[im 50/50]
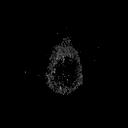

[Series 5: T2 · axial · 5.0mm · 0.36mm/px · z∈[-40,+102]mm · 3 of 23 slices shown]
[im 1/23]
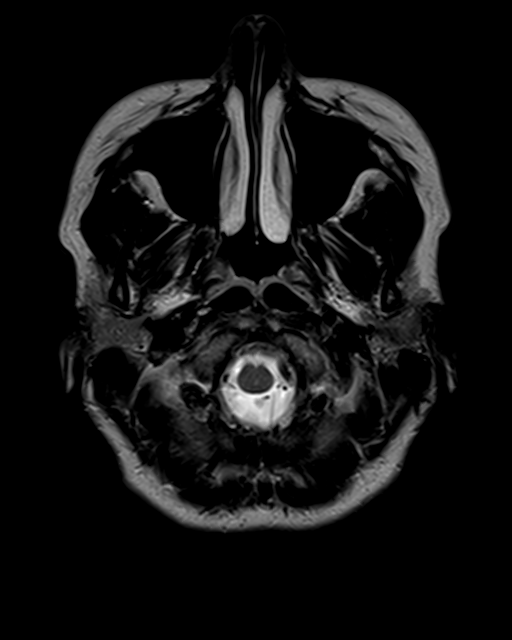
[im 12/23]
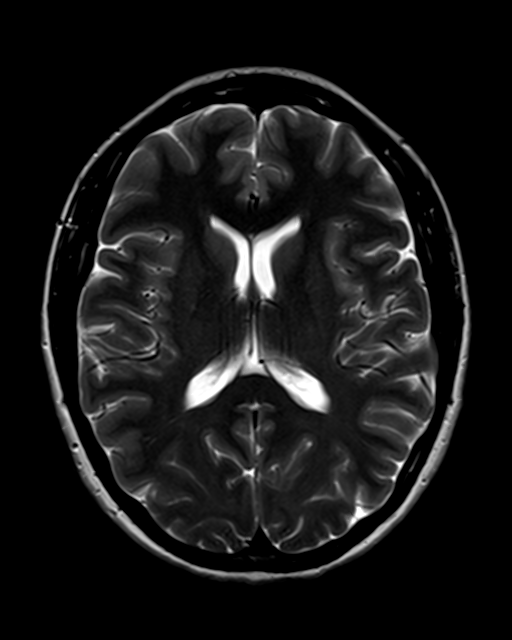
[im 23/23]
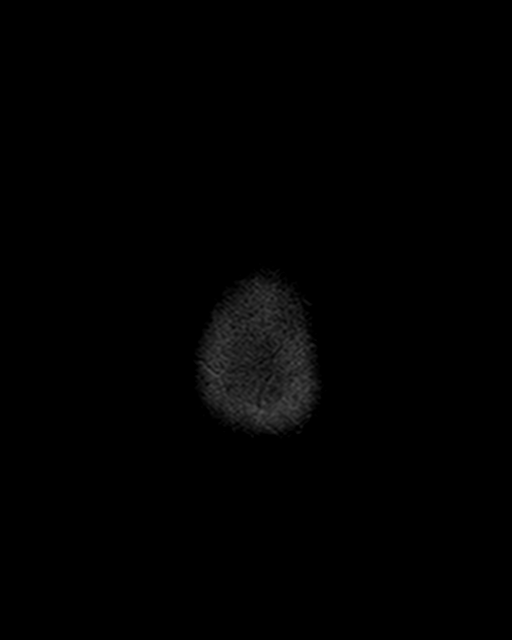

[Series 6: FLAIR · axial · 5.0mm · 0.45mm/px · z∈[-43,+100]mm · 3 of 23 slices shown]
[im 1/23]
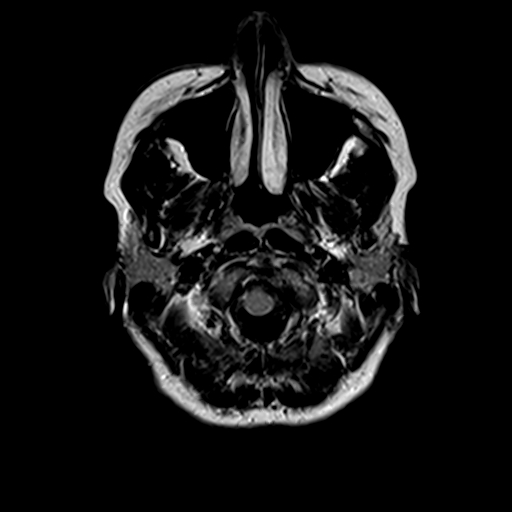
[im 12/23]
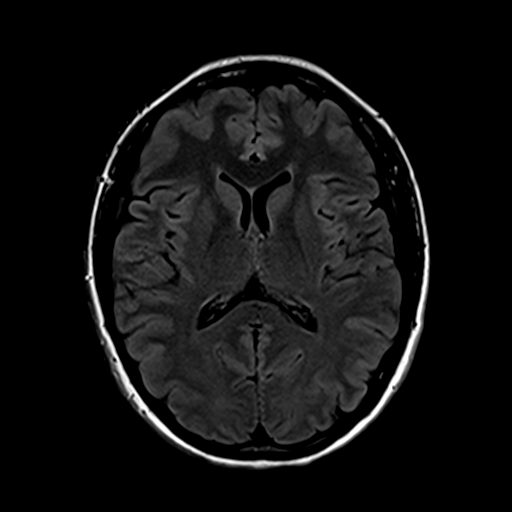
[im 23/23]
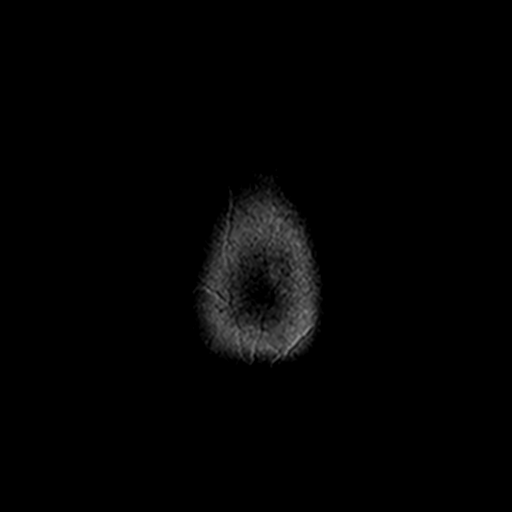

[Series 7: axial grad (blood) · axial · 5.0mm · 0.45mm/px · z∈[-43,+105]mm · 3 of 23 slices shown]
[im 1/23]
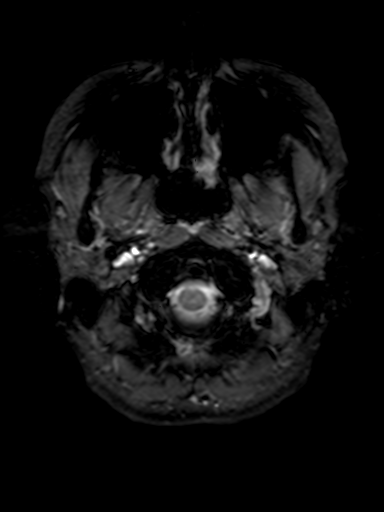
[im 12/23]
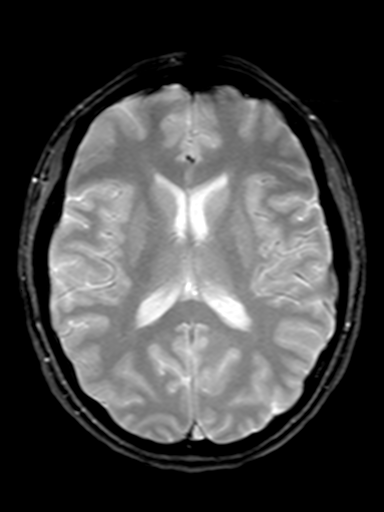
[im 23/23]
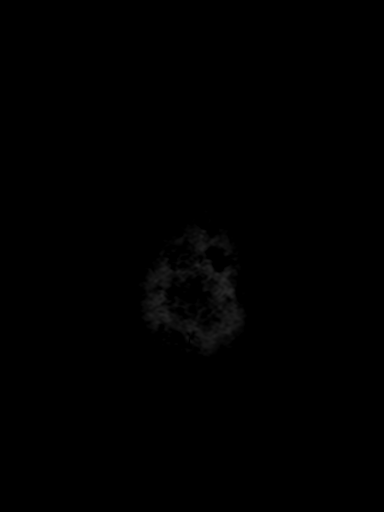

[Series 8: sag 3mm · sagittal · 3.0mm · 0.33mm/px · 1 of 11 slices shown]
[im 1/11]
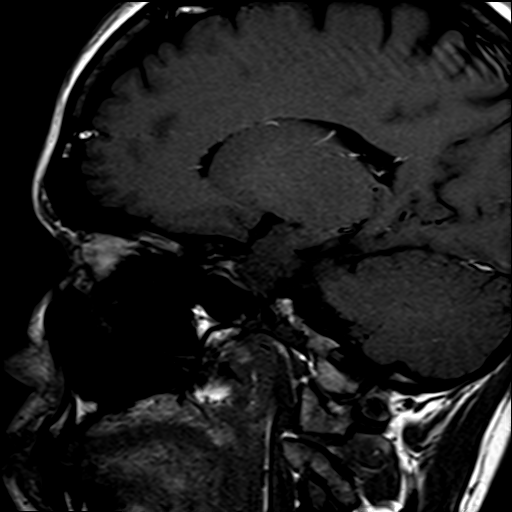

[Series 9: cor 3mm · coronal · 3.0mm · 0.33mm/px · 1 of 11 slices shown]
[im 1/11]
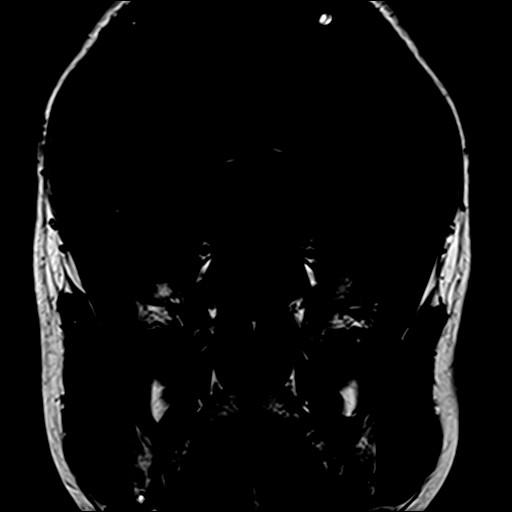

[Series 10: pre cor dynamic · coronal · non-contrast · 3.0mm · 0.35mm/px · 1 of 7 slices shown]
[im 1/7]
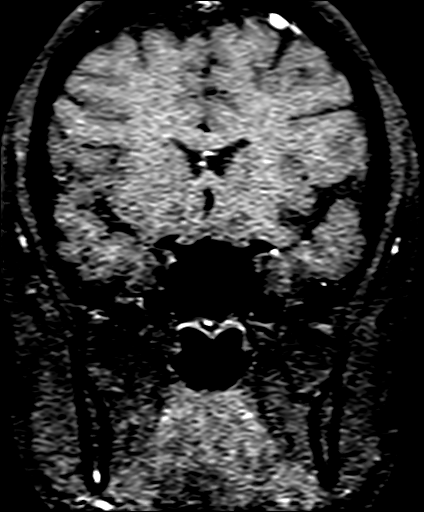

[Series 11: post fs cor · coronal · 3.0mm · 0.35mm/px · 1 of 7 slices shown (1 of 6)]
[im 1/7]
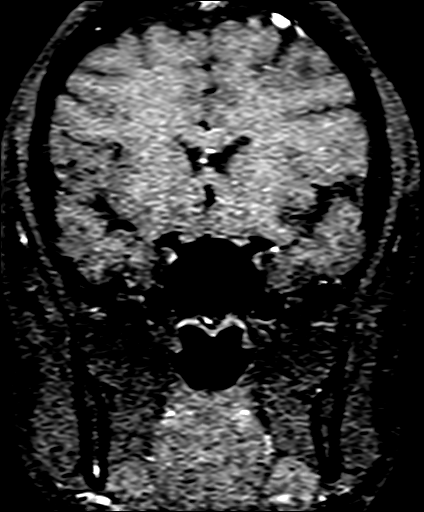

[Series 12: post fs cor · coronal · 3.0mm · 0.35mm/px · 1 of 7 slices shown (2 of 6)]
[im 1/7]
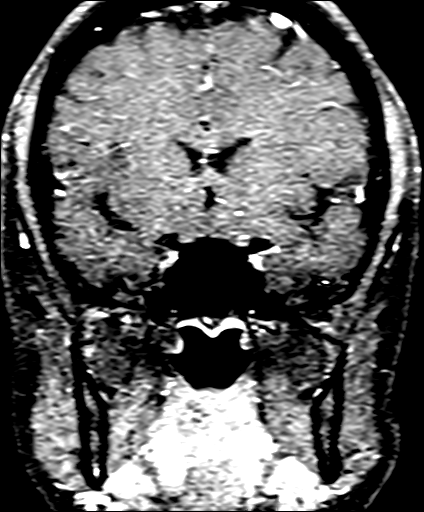

[Series 13: post fs cor · coronal · 3.0mm · 0.35mm/px · 1 of 7 slices shown (3 of 6)]
[im 1/7]
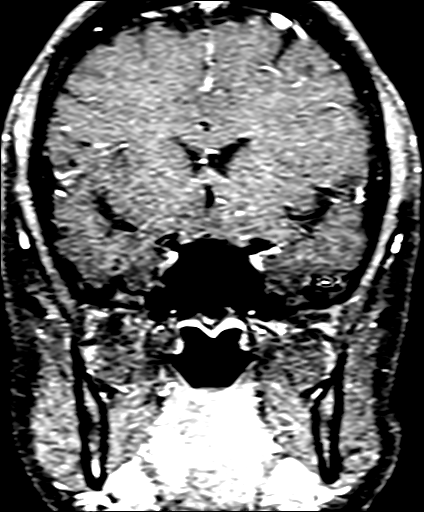

[Series 14: post fs cor · coronal · 3.0mm · 0.35mm/px · 1 of 7 slices shown (4 of 6)]
[im 1/7]
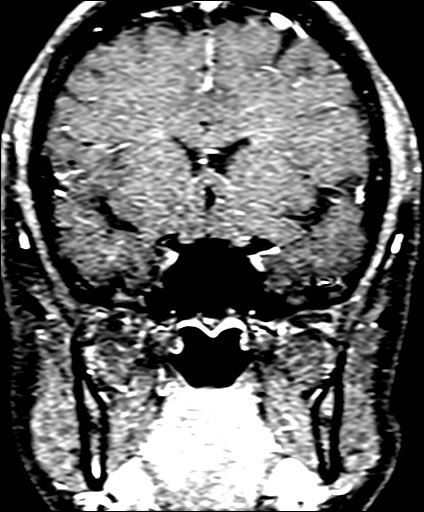

[Series 15: post fs cor · coronal · 3.0mm · 0.35mm/px · 1 of 7 slices shown (5 of 6)]
[im 1/7]
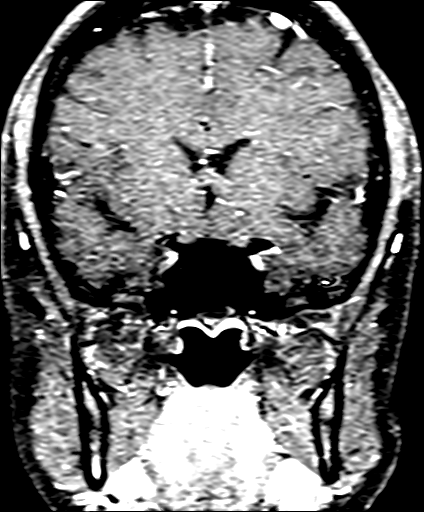

[Series 16: post fs cor · coronal · 3.0mm · 0.35mm/px · 1 of 7 slices shown (6 of 6)]
[im 1/7]
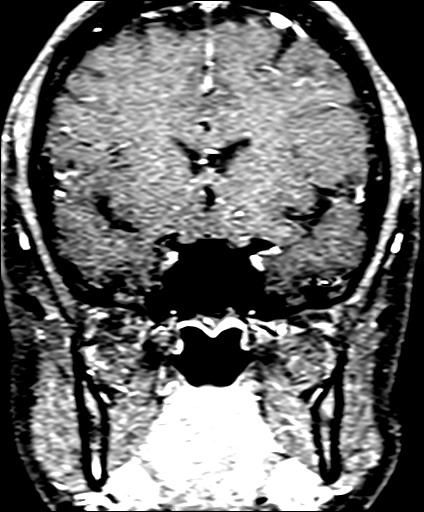

[33 of 48 positions shown; findings below may reference images not displayed]

FINDINGS: There is no evidence of acute infarct, intracranial hemorrhage,
mass, midline shift, or extra-axial fluid collection. Ventricles and
sulci are normal. Brain parenchyma is normal in signal. No abnormal
enhancement is identified.

Orbits are unremarkable. Paranasal sinuses and mastoid air cells are
clear. Major intracranial vascular flow voids are preserved. 11 mm
midline frontal vertex scalp lesion is unchanged, as is a 13 mm left
parietal scalp lesion.

Dedicated imaging was performed through the sella turcica to
evaluate the pituitary gland. The pituitary gland is normal in size
and demonstrates homogeneous enhancement. The infundibulum is
midline. Optic chiasm and cavernous sinuses are unremarkable.
IMPRESSION: Unremarkable appearance of the brain and pituitary gland.

## 2018-01-26 ENCOUNTER — Other Ambulatory Visit: Payer: Self-pay | Admitting: Chiropractic Medicine

## 2018-01-26 DIAGNOSIS — I872 Venous insufficiency (chronic) (peripheral): Secondary | ICD-10-CM

## 2018-02-25 ENCOUNTER — Ambulatory Visit
Admission: RE | Admit: 2018-02-25 | Discharge: 2018-02-25 | Disposition: A | Discharge status: Home / Self Care | Payer: BLUE CROSS/BLUE SHIELD | Source: Ambulatory Visit | Attending: Chiropractic Medicine | Admitting: Chiropractic Medicine

## 2018-02-25 DIAGNOSIS — I872 Venous insufficiency (chronic) (peripheral): Secondary | ICD-10-CM

## 2018-02-25 NOTE — Consult Note (Signed)
Chief Complaint: Patient was seen in consultation today for No chief complaint on file.  at the request of Dane,Darcy D  Referring Physician(s): Dane,Darcy D  History of Present Illness: Desiree Evans is a 38 y.o. female who complains of lower extremity pain and edema over the last few months.  She describes pain and swelling at the anterior shins bilaterally.  She denies any swelling at the ankles.  She denies any skin breakdown or varicosities.  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  She also has a positive family history for venous insufficiency in her mother. She is gravida 5 para 4.  Past Medical History:  Diagnosis Date  . Asthma    sesonal allergies, exercise induced  . Bipolar disorder ([place])   . Complication of anesthesia    x 1just with emergency appendectomy  . Disorder of autonomic nervous system   . Head injury with loss of consciousness ([place]) 2013   concussion  . Headache   . Hx of blood clots 2003   clot to right eye, ? due to [facility]  . Jackhammer esophagus   . Palpitations   . Polyneuropathy   . PONV (postoperative nausea and vomiting)   . POTS (postural orthostatic tachycardia syndrome)   . Psychogenic nonepileptic seizure    dx 2015 [facility]  . Sjoegren syndrome [facility])     Past Surgical History:  Procedure Laterality Date  . APPENDECTOMY  1997  . CHOLECYSTECTOMY  2003  . COLONOSCOPY  2017  . DILATION AND CURETTAGE, DIAGNOSTIC / THERAPEUTIC  2002   After miscarrige of twins  . DILITATION & CURRETTAGE/HYSTROSCOPY WITH HYDROTHERMAL ABLATION  2012  . ESOPHAGOGASTRODUODENOSCOPY  2017   with ballon dilation  . MUSCLE BIOPSY    . MUSCLE BIOPSY Right 09/22/2016   Procedure: RIGHT THIGH MUSCLE BIOPSY;  Surgeon: Donnie Mesa, MD;  Location: [place];  Service: General;  Laterality: Right;  . SKIN GRAFT  1994   right buttocks to right foot  . URETHRAL DILATION  1982    Allergies: Avocado;  Banana; Conj estrog-medroxyprogest ace; Conjugated estrogens; Latex; Progesterone; Sumatriptan; Geodon [ziprasidone hcl]; Hydroxychloroquine; Sulfa antibiotics; Sulfamethoxazole; Sulfasalazine; and Sulfur  Medications: Prior to Admission medications   Medication Sig Start Date End Date Taking? Authorizing Provider  Albuterol Sulfate (PROAIR RESPICLICK) 563 (90 Base) MCG/ACT AEPB Inhale 1-2 puffs into the lungs every 4 (four) hours as needed (shortness of breath).    [provider]  Armodafinil 250 MG tablet Take 250 mg by mouth daily. 09/11/16   [provider]  ARMOUR THYROID 15 MG tablet Take 15 mg by mouth daily before breakfast. 09/04/16   [provider]  BYSTOLIC 2.5 MG tablet Take 2.5 mg by mouth at bedtime. 09/03/16   [provider]  cetirizine (ZYRTEC) 10 MG tablet Take 20 mg by mouth 2 (two) times daily.    [provider]  gabapentin (NEURONTIN) 800 MG tablet Take 800 mg by mouth 3 (three) times daily.    [provider]  midodrine (PROAMATINE) 2.5 MG tablet Take 5 mg by mouth 3 (three) times daily. 09/04/16   [provider]  mometasone-formoterol (DULERA) 100-5 MCG/ACT AERO Inhale 1 puff into the lungs 2 (two) times daily.    [provider]  pantoprazole (PROTONIX) 40 MG tablet Take 40 mg by mouth at bedtime.  09/04/16   [provider]  polyethylene glycol (  MIRALAX / GLYCOLAX) packet Take 17 g by mouth every other day. Takes every other night.    [provider]  pyridostigmine (MESTINON) 60 MG tablet Take 120 mg by mouth 3 (three) times daily. 09/03/16   [provider]  venlafaxine XR (EFFEXOR-XR) 37.5 MG 24 hr capsule Take 75 mg by mouth daily. 09/04/16   [provider]  Vitamin D, Ergocalciferol, (DRISDOL) 50000 units CAPS capsule Take 50,000 Units by mouth once a week. Wednesdays 08/12/16   [provider]     No family history on file.  Social History    Socioeconomic History  . Marital status: Married    Spouse name: Not on file  . Number of children: Not on file  . Years of education: Not on file  . Highest education level: Not on file  Occupational History  . Not on file  Social Needs  . Financial resource strain: Not on file  . Food insecurity:    Worry: Not on file    Inability: Not on file  . Transportation needs:    Medical: Not on file    Non-medical: Not on file  Tobacco Use  . Smoking status: Never Smoker  . Smokeless tobacco: Never Used  Substance and Sexual Activity  . Alcohol use: No  . Drug use: No  . Sexual activity: Not on file  Lifestyle  . Physical activity:    Days per week: Not on file    Minutes per session: Not on file  . Stress: Not on file  Relationships  . Social connections:    Talks on phone: Not on file    Gets together: Not on file    Attends religious service: Not on file    Active member of club or organization: Not on file    Attends meetings of clubs or organizations: Not on file    Relationship status: Not on file  Other Topics Concern  . Not on file  Social History Narrative  . Not on file     Review of Systems: A 12 point ROS discussed and pertinent positives are indicated in the HPI above.  All other systems are negative.  Review of Systems  No fevers or chills.  No nausea or vomiting.  No rash.   Vital Signs: There were no vitals taken for this visit.  Physical Exam She is in no apparent distress.  Examination of the lower extremities demonstrates no ankle edema or skin breakdown.  Dorsalis pedis and posterior tibial pulses are intact distally.  There is no discoloration.  At the anterior left shin just below the medial tibial plateau, there is localized swelling.  This is also present on the right to a lesser degree.    Imaging: No results found.  A venous Doppler study was performed today.  There is no evidence of DVT and no evidence of venous  insufficiency.  Labs:  CBC: No results for input(s): WBC, HGB, HCT, PLT in the last 8760 hours.  COAGS: No results for input(s): INR, APTT in the last 8760 hours.  BMP: No results for input(s): NA, K, CL, CO2, GLUCOSE, BUN, CALCIUM, CREATININE, GFRNONAA, GFRAA in the last 8760 hours.  Invalid input(s): CMP  LIVER FUNCTION TESTS: No results for input(s): BILITOT, AST, ALT, ALKPHOS, PROT, ALBUMIN in the last 8760 hours.  TUMOR MARKERS: No results for input(s): AFPTM, CEA, CA199, CHROMGRNA in the last 8760 hours.  Assessment and Plan:  Ms. Savoia has localized swelling at the upper anterior  shins bilaterally but no evidence of venous insufficiency and no evidence of DVT.  This abnormality may simply represent localized fat deposition.  No treatment for venous insufficiency is recommended at this time.  She does stand for long periods of time, therefore graded compression stockings were encouraged.  Thank you for this interesting consult.  I greatly enjoyed meeting KADEN DUNKEL and look forward to participating in their care.  A copy of this report was sent to the requesting provider on this date.  Electronically Signed: Art A Eduin Friedel 02/25/2018, 2:43 PM   I spent a total of  40 Minutes   in face to face in clinical consultation, greater than 50% of which was counseling/coordinating care for lower extremity swelling and possible venous insufficiency.

## 2019-07-21 IMAGING — US US EXTREM LOW VENOUS
1 series · 13 of 24 positions shown · non-contrast
Comparison: None.

CLINICAL DATA: Lower extremity edema and pain.

EXAM:
BILATERAL LOWER EXTREMITY VENOUS DUPLEX ULTRASOUND
TECHNIQUE: Gray-scale sonography with graded compression, as well as color
Doppler and duplex ultrasound, were performed to evaluate the deep
and superficial veins of both lower extremities. Spectral Doppler
was utilized to evaluate flow at rest and with distal augmentation
maneuvers. A complete superficial venous insufficiency exam was
performed in the upright standing. I personally performed the
technical portion of the exam.

[Series 1: us extrem low venous · 0.06mm/px · 13 of 51 slices shown]
[im 1/51]
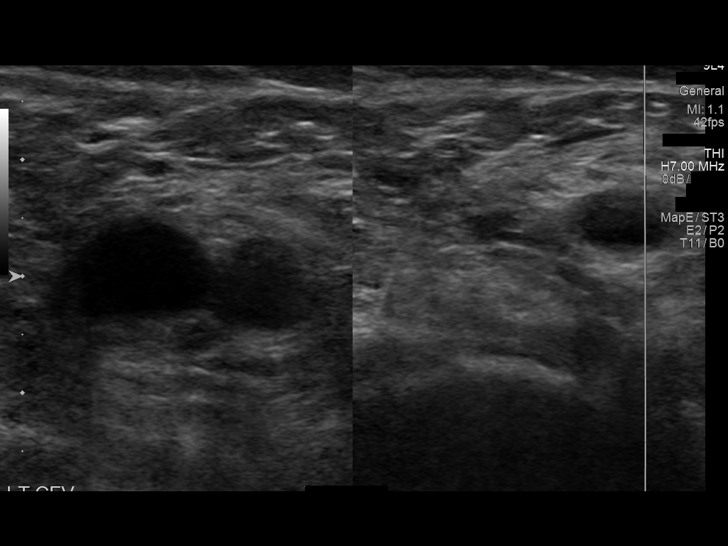
[im 5/51]
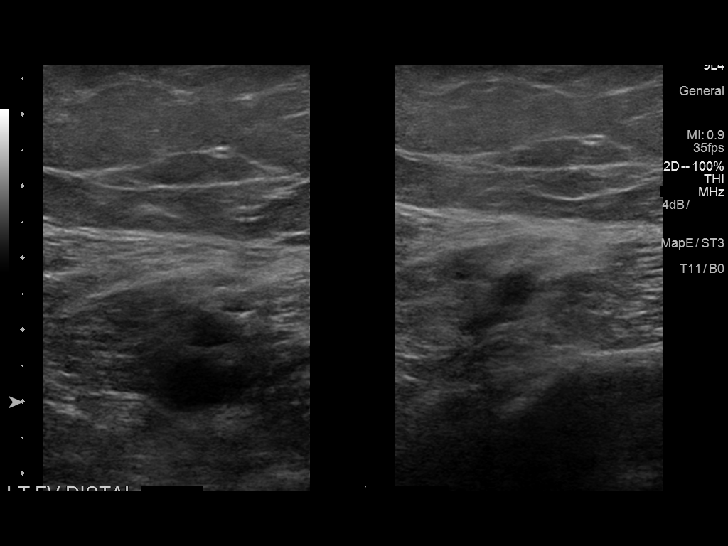
[im 9/51]
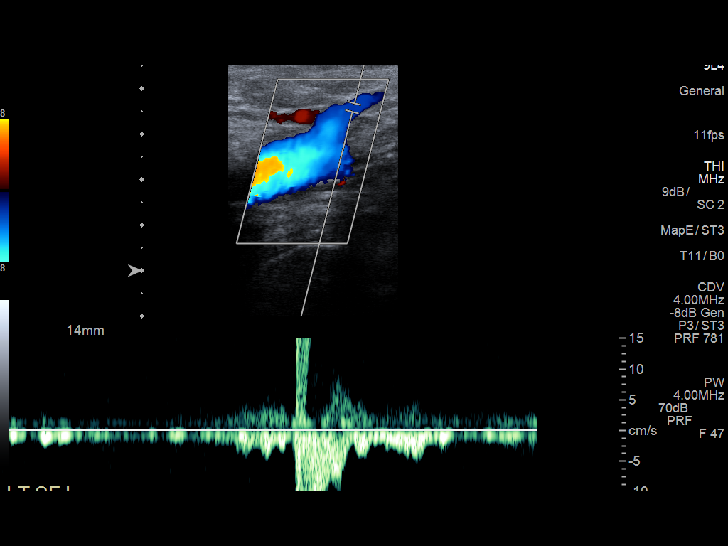
[im 14/51]
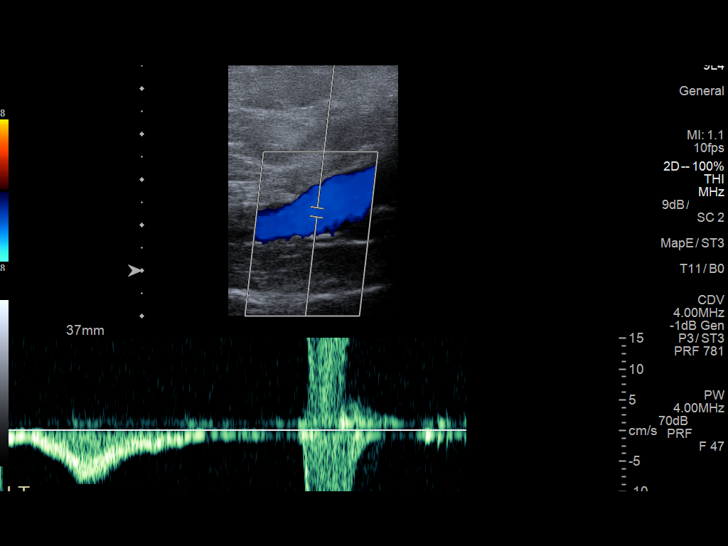
[im 18/51]
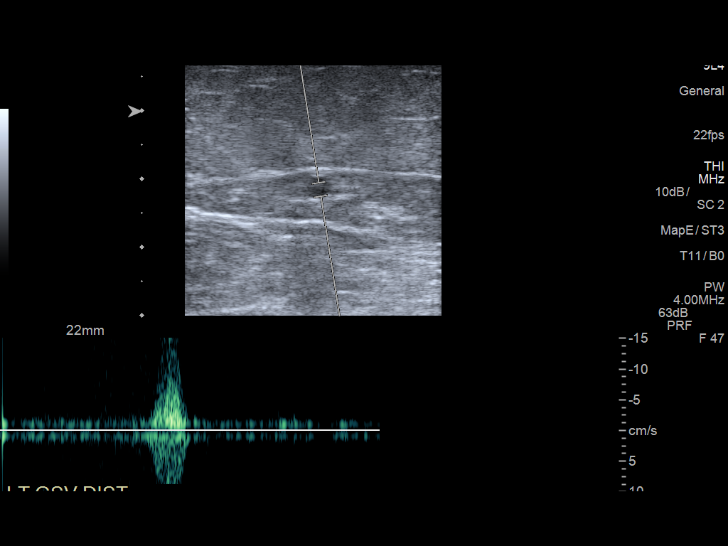
[im 22/51]
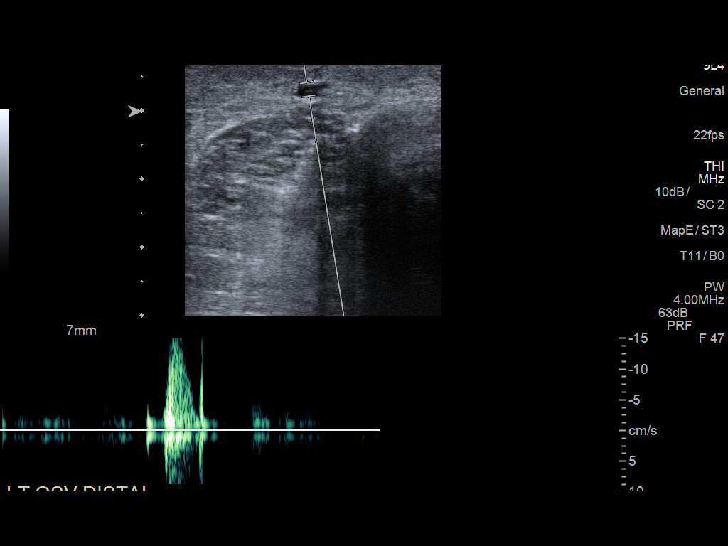
[im 27/51]
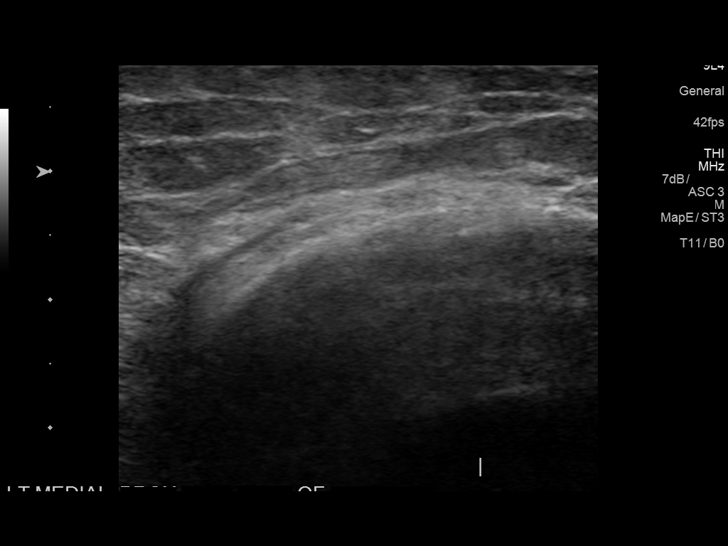
[im 29/51]
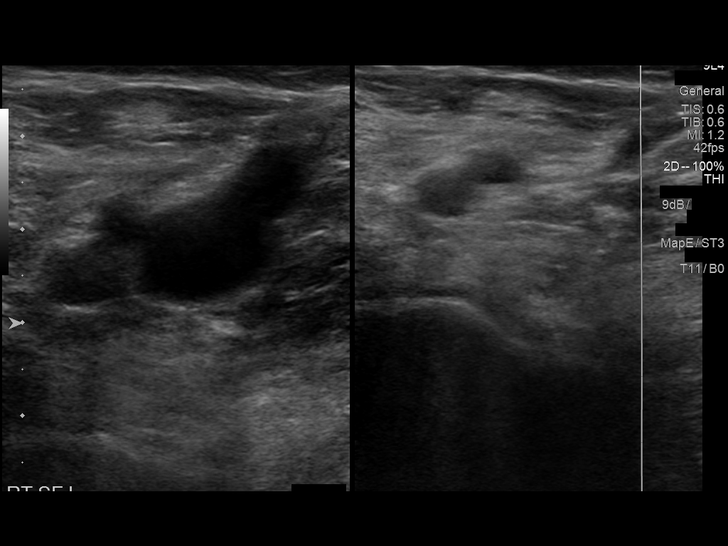
[im 33/51]
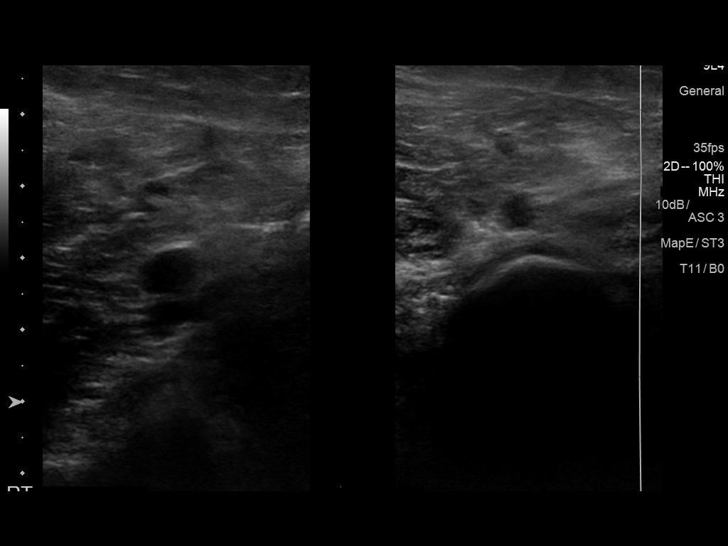
[im 37/51]
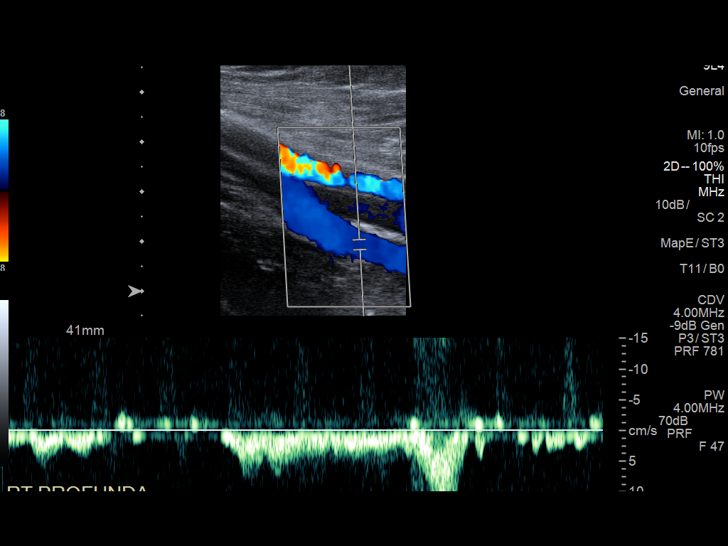
[im 42/51]
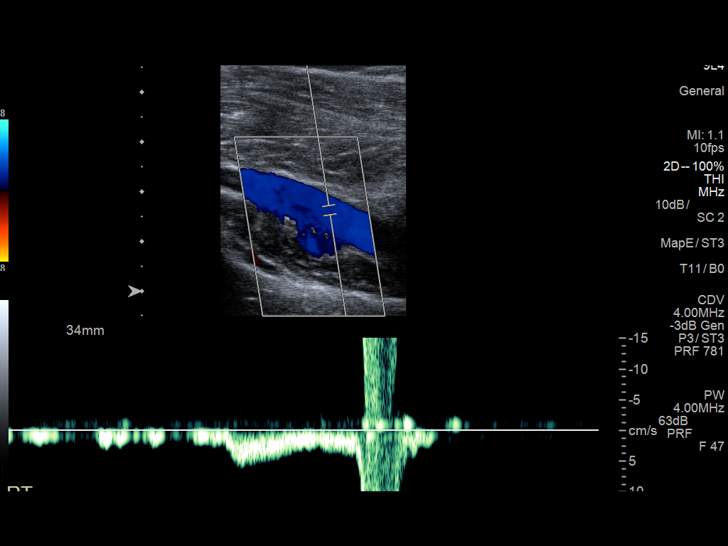
[im 46/51]
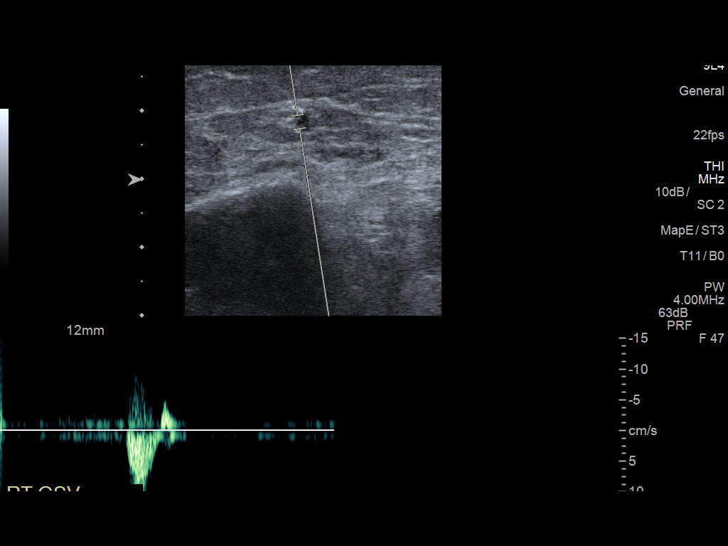
[im 51/51]
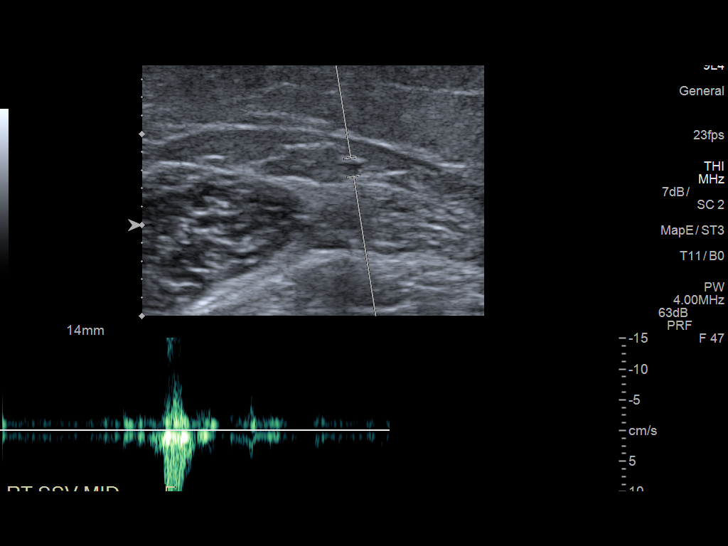

[13 of 24 positions shown; findings below may reference images not displayed]

FINDINGS: RIGHT Deep Venous System:

Evaluation of the deep venous system including the common femoral,
femoral, profunda femoral, popliteal and calf veins (where visible)
demonstrate no evidence of deep venous thrombosis. The vessels are
compressible and demonstrate normal respiratory phasicity and
response to augmentation. No evidence of deep venous reflux.

RIGHT Superficial Venous System:

SFJ: No reflux

GSV Prox Thigh: No reflux

GSV Mid Thigh: No reflux

GSV Lower Thigh: No reflux

GSV Prox Calf: No reflux

GSV Mid Calf: No reflux

GSV Distal Calf: No reflux

SPJ: No reflux

SSV Prox: No reflux

SSV Mid: No reflux

SSV Distal: No reflux

Other: Noncontributory

LEFT Deep Venous System:

Evaluation of the deep venous system including the common femoral,
femoral, profunda femoral, popliteal and calf veins (where visible)
demonstrate no evidence of deep venous thrombosis. The vessels are
compressible and demonstrate normal respiratory phasicity and
response to augmentation. No evidence of deep venous reflux.

LEFT Superficial Venous System:

SFJ: No reflux

GSV Prox Thigh: No reflux

GSV Mid Thigh: No reflux

GSV Lower Thigh: No reflux

GSV Prox Calf: No reflux

GSV Mid Calf: No reflux

GSV Distal Calf: No reflux

SPJ: No reflux

SSV Prox: No reflux

SSV Mid: No reflux

SSV Distal: No reflux

Other: Noncontributory
IMPRESSION: No evidence of DVT and no evidence of lower extremity venous reflux.

## 2020-11-08 DIAGNOSIS — M546 Pain in thoracic spine: Secondary | ICD-10-CM | POA: Diagnosis not present

## 2020-11-08 DIAGNOSIS — M9901 Segmental and somatic dysfunction of cervical region: Secondary | ICD-10-CM | POA: Diagnosis not present

## 2020-11-08 DIAGNOSIS — M9902 Segmental and somatic dysfunction of thoracic region: Secondary | ICD-10-CM | POA: Diagnosis not present

## 2020-11-08 DIAGNOSIS — M5383 Other specified dorsopathies, cervicothoracic region: Secondary | ICD-10-CM | POA: Diagnosis not present

## 2020-11-15 DIAGNOSIS — M9901 Segmental and somatic dysfunction of cervical region: Secondary | ICD-10-CM | POA: Diagnosis not present

## 2020-11-15 DIAGNOSIS — M5383 Other specified dorsopathies, cervicothoracic region: Secondary | ICD-10-CM | POA: Diagnosis not present

## 2020-11-15 DIAGNOSIS — M9902 Segmental and somatic dysfunction of thoracic region: Secondary | ICD-10-CM | POA: Diagnosis not present

## 2020-11-15 DIAGNOSIS — M546 Pain in thoracic spine: Secondary | ICD-10-CM | POA: Diagnosis not present

## 2021-01-10 DIAGNOSIS — G43909 Migraine, unspecified, not intractable, without status migrainosus: Secondary | ICD-10-CM | POA: Diagnosis not present

## 2021-01-10 DIAGNOSIS — R29818 Other symptoms and signs involving the nervous system: Secondary | ICD-10-CM | POA: Diagnosis not present

## 2021-01-10 DIAGNOSIS — R531 Weakness: Secondary | ICD-10-CM | POA: Diagnosis not present

## 2021-01-10 DIAGNOSIS — R479 Unspecified speech disturbances: Secondary | ICD-10-CM | POA: Diagnosis not present

## 2021-01-10 DIAGNOSIS — J385 Laryngeal spasm: Secondary | ICD-10-CM | POA: Diagnosis not present

## 2021-01-10 DIAGNOSIS — R4789 Other speech disturbances: Secondary | ICD-10-CM | POA: Diagnosis not present

## 2021-01-10 DIAGNOSIS — J383 Other diseases of vocal cords: Secondary | ICD-10-CM | POA: Diagnosis not present

## 2021-01-10 DIAGNOSIS — R519 Headache, unspecified: Secondary | ICD-10-CM | POA: Diagnosis not present

## 2021-01-11 DIAGNOSIS — G629 Polyneuropathy, unspecified: Secondary | ICD-10-CM | POA: Diagnosis not present

## 2021-01-11 DIAGNOSIS — R1310 Dysphagia, unspecified: Secondary | ICD-10-CM | POA: Diagnosis not present

## 2021-01-11 DIAGNOSIS — Z6830 Body mass index (BMI) 30.0-30.9, adult: Secondary | ICD-10-CM | POA: Diagnosis not present

## 2021-01-11 DIAGNOSIS — Z1331 Encounter for screening for depression: Secondary | ICD-10-CM | POA: Diagnosis not present

## 2021-01-15 DIAGNOSIS — M3500 Sicca syndrome, unspecified: Secondary | ICD-10-CM | POA: Diagnosis not present

## 2021-01-15 DIAGNOSIS — K224 Dyskinesia of esophagus: Secondary | ICD-10-CM | POA: Diagnosis not present

## 2021-01-15 DIAGNOSIS — J45990 Exercise induced bronchospasm: Secondary | ICD-10-CM | POA: Diagnosis not present

## 2021-01-15 DIAGNOSIS — G40909 Epilepsy, unspecified, not intractable, without status epilepticus: Secondary | ICD-10-CM | POA: Diagnosis not present

## 2021-01-15 DIAGNOSIS — Z79899 Other long term (current) drug therapy: Secondary | ICD-10-CM | POA: Diagnosis not present

## 2021-01-15 DIAGNOSIS — E039 Hypothyroidism, unspecified: Secondary | ICD-10-CM | POA: Diagnosis not present

## 2021-01-15 DIAGNOSIS — Z882 Allergy status to sulfonamides status: Secondary | ICD-10-CM | POA: Diagnosis not present

## 2021-01-15 DIAGNOSIS — F319 Bipolar disorder, unspecified: Secondary | ICD-10-CM | POA: Diagnosis not present

## 2021-01-15 DIAGNOSIS — Z91040 Latex allergy status: Secondary | ICD-10-CM | POA: Diagnosis not present

## 2021-01-15 DIAGNOSIS — Q796 Ehlers-Danlos syndrome, unspecified: Secondary | ICD-10-CM | POA: Diagnosis not present

## 2021-01-15 DIAGNOSIS — K220 Achalasia of cardia: Secondary | ICD-10-CM | POA: Diagnosis not present

## 2021-01-15 DIAGNOSIS — R1310 Dysphagia, unspecified: Secondary | ICD-10-CM | POA: Diagnosis not present

## 2021-01-15 DIAGNOSIS — Z9049 Acquired absence of other specified parts of digestive tract: Secondary | ICD-10-CM | POA: Diagnosis not present

## 2021-02-21 DIAGNOSIS — N926 Irregular menstruation, unspecified: Secondary | ICD-10-CM | POA: Diagnosis not present

## 2021-02-21 DIAGNOSIS — R635 Abnormal weight gain: Secondary | ICD-10-CM | POA: Diagnosis not present

## 2021-02-21 DIAGNOSIS — F4312 Post-traumatic stress disorder, chronic: Secondary | ICD-10-CM | POA: Diagnosis not present

## 2021-02-21 DIAGNOSIS — G43909 Migraine, unspecified, not intractable, without status migrainosus: Secondary | ICD-10-CM | POA: Diagnosis not present

## 2021-03-11 DIAGNOSIS — R2990 Unspecified symptoms and signs involving the nervous system: Secondary | ICD-10-CM | POA: Diagnosis not present

## 2021-03-11 DIAGNOSIS — G43909 Migraine, unspecified, not intractable, without status migrainosus: Secondary | ICD-10-CM | POA: Diagnosis not present

## 2021-03-11 DIAGNOSIS — R200 Anesthesia of skin: Secondary | ICD-10-CM | POA: Diagnosis not present

## 2021-03-25 DIAGNOSIS — R200 Anesthesia of skin: Secondary | ICD-10-CM | POA: Diagnosis not present

## 2021-03-25 DIAGNOSIS — R2990 Unspecified symptoms and signs involving the nervous system: Secondary | ICD-10-CM | POA: Diagnosis not present

## 2021-03-25 DIAGNOSIS — G43909 Migraine, unspecified, not intractable, without status migrainosus: Secondary | ICD-10-CM | POA: Diagnosis not present

## 2021-04-11 DIAGNOSIS — F4312 Post-traumatic stress disorder, chronic: Secondary | ICD-10-CM | POA: Diagnosis not present

## 2021-04-11 DIAGNOSIS — M3219 Other organ or system involvement in systemic lupus erythematosus: Secondary | ICD-10-CM | POA: Diagnosis not present

## 2021-05-08 DIAGNOSIS — M7912 Myalgia of auxiliary muscles, head and neck: Secondary | ICD-10-CM | POA: Diagnosis not present

## 2021-05-08 DIAGNOSIS — M9901 Segmental and somatic dysfunction of cervical region: Secondary | ICD-10-CM | POA: Diagnosis not present

## 2021-05-08 DIAGNOSIS — M5383 Other specified dorsopathies, cervicothoracic region: Secondary | ICD-10-CM | POA: Diagnosis not present

## 2021-05-08 DIAGNOSIS — M9902 Segmental and somatic dysfunction of thoracic region: Secondary | ICD-10-CM | POA: Diagnosis not present

## 2021-05-13 DIAGNOSIS — M7912 Myalgia of auxiliary muscles, head and neck: Secondary | ICD-10-CM | POA: Diagnosis not present

## 2021-05-13 DIAGNOSIS — M9901 Segmental and somatic dysfunction of cervical region: Secondary | ICD-10-CM | POA: Diagnosis not present

## 2021-05-13 DIAGNOSIS — M5383 Other specified dorsopathies, cervicothoracic region: Secondary | ICD-10-CM | POA: Diagnosis not present

## 2021-05-20 DIAGNOSIS — M9901 Segmental and somatic dysfunction of cervical region: Secondary | ICD-10-CM | POA: Diagnosis not present

## 2021-05-20 DIAGNOSIS — M5383 Other specified dorsopathies, cervicothoracic region: Secondary | ICD-10-CM | POA: Diagnosis not present

## 2021-05-20 DIAGNOSIS — M7912 Myalgia of auxiliary muscles, head and neck: Secondary | ICD-10-CM | POA: Diagnosis not present

## 2021-05-23 DIAGNOSIS — F4312 Post-traumatic stress disorder, chronic: Secondary | ICD-10-CM | POA: Diagnosis not present

## 2021-05-23 DIAGNOSIS — R635 Abnormal weight gain: Secondary | ICD-10-CM | POA: Diagnosis not present

## 2021-05-23 DIAGNOSIS — M3219 Other organ or system involvement in systemic lupus erythematosus: Secondary | ICD-10-CM | POA: Diagnosis not present

## 2021-05-23 DIAGNOSIS — G43909 Migraine, unspecified, not intractable, without status migrainosus: Secondary | ICD-10-CM | POA: Diagnosis not present

## 2021-06-04 DIAGNOSIS — M9901 Segmental and somatic dysfunction of cervical region: Secondary | ICD-10-CM | POA: Diagnosis not present

## 2021-06-04 DIAGNOSIS — M7912 Myalgia of auxiliary muscles, head and neck: Secondary | ICD-10-CM | POA: Diagnosis not present

## 2021-06-04 DIAGNOSIS — M5383 Other specified dorsopathies, cervicothoracic region: Secondary | ICD-10-CM | POA: Diagnosis not present

## 2021-06-11 DIAGNOSIS — G43909 Migraine, unspecified, not intractable, without status migrainosus: Secondary | ICD-10-CM | POA: Diagnosis not present

## 2021-06-11 DIAGNOSIS — G4719 Other hypersomnia: Secondary | ICD-10-CM | POA: Diagnosis not present

## 2021-06-17 DIAGNOSIS — M9901 Segmental and somatic dysfunction of cervical region: Secondary | ICD-10-CM | POA: Diagnosis not present

## 2021-06-17 DIAGNOSIS — M7912 Myalgia of auxiliary muscles, head and neck: Secondary | ICD-10-CM | POA: Diagnosis not present

## 2021-06-17 DIAGNOSIS — M5383 Other specified dorsopathies, cervicothoracic region: Secondary | ICD-10-CM | POA: Diagnosis not present

## 2021-07-03 DIAGNOSIS — Z6830 Body mass index (BMI) 30.0-30.9, adult: Secondary | ICD-10-CM | POA: Diagnosis not present

## 2021-07-03 DIAGNOSIS — K3184 Gastroparesis: Secondary | ICD-10-CM | POA: Diagnosis not present

## 2021-07-03 DIAGNOSIS — R1314 Dysphagia, pharyngoesophageal phase: Secondary | ICD-10-CM | POA: Diagnosis not present

## 2021-08-06 DIAGNOSIS — M7912 Myalgia of auxiliary muscles, head and neck: Secondary | ICD-10-CM | POA: Diagnosis not present

## 2021-08-06 DIAGNOSIS — M5383 Other specified dorsopathies, cervicothoracic region: Secondary | ICD-10-CM | POA: Diagnosis not present

## 2021-08-06 DIAGNOSIS — M9901 Segmental and somatic dysfunction of cervical region: Secondary | ICD-10-CM | POA: Diagnosis not present

## 2021-08-21 DIAGNOSIS — G43909 Migraine, unspecified, not intractable, without status migrainosus: Secondary | ICD-10-CM | POA: Diagnosis not present

## 2021-08-22 DIAGNOSIS — M7912 Myalgia of auxiliary muscles, head and neck: Secondary | ICD-10-CM | POA: Diagnosis not present

## 2021-08-22 DIAGNOSIS — M5383 Other specified dorsopathies, cervicothoracic region: Secondary | ICD-10-CM | POA: Diagnosis not present

## 2021-08-22 DIAGNOSIS — M9901 Segmental and somatic dysfunction of cervical region: Secondary | ICD-10-CM | POA: Diagnosis not present

## 2021-09-04 DIAGNOSIS — J069 Acute upper respiratory infection, unspecified: Secondary | ICD-10-CM | POA: Diagnosis not present

## 2021-09-07 DIAGNOSIS — R059 Cough, unspecified: Secondary | ICD-10-CM | POA: Diagnosis not present

## 2021-09-07 DIAGNOSIS — J069 Acute upper respiratory infection, unspecified: Secondary | ICD-10-CM | POA: Diagnosis not present

## 2021-09-10 DIAGNOSIS — Z79899 Other long term (current) drug therapy: Secondary | ICD-10-CM | POA: Diagnosis not present

## 2021-09-10 DIAGNOSIS — G90A Postural orthostatic tachycardia syndrome (POTS): Secondary | ICD-10-CM | POA: Diagnosis not present

## 2021-10-01 DIAGNOSIS — M5383 Other specified dorsopathies, cervicothoracic region: Secondary | ICD-10-CM | POA: Diagnosis not present

## 2021-10-01 DIAGNOSIS — M9901 Segmental and somatic dysfunction of cervical region: Secondary | ICD-10-CM | POA: Diagnosis not present

## 2021-10-01 DIAGNOSIS — M7912 Myalgia of auxiliary muscles, head and neck: Secondary | ICD-10-CM | POA: Diagnosis not present

## 2021-10-15 DIAGNOSIS — M7912 Myalgia of auxiliary muscles, head and neck: Secondary | ICD-10-CM | POA: Diagnosis not present

## 2021-10-15 DIAGNOSIS — M9901 Segmental and somatic dysfunction of cervical region: Secondary | ICD-10-CM | POA: Diagnosis not present

## 2021-10-15 DIAGNOSIS — M5383 Other specified dorsopathies, cervicothoracic region: Secondary | ICD-10-CM | POA: Diagnosis not present

## 2021-11-01 DIAGNOSIS — G4719 Other hypersomnia: Secondary | ICD-10-CM | POA: Diagnosis not present

## 2021-11-01 DIAGNOSIS — G43909 Migraine, unspecified, not intractable, without status migrainosus: Secondary | ICD-10-CM | POA: Diagnosis not present

## 2021-11-01 DIAGNOSIS — G90A Postural orthostatic tachycardia syndrome (POTS): Secondary | ICD-10-CM | POA: Diagnosis not present

## 2021-12-03 DIAGNOSIS — M7912 Myalgia of auxiliary muscles, head and neck: Secondary | ICD-10-CM | POA: Diagnosis not present

## 2021-12-03 DIAGNOSIS — M9901 Segmental and somatic dysfunction of cervical region: Secondary | ICD-10-CM | POA: Diagnosis not present

## 2021-12-03 DIAGNOSIS — M5383 Other specified dorsopathies, cervicothoracic region: Secondary | ICD-10-CM | POA: Diagnosis not present

## 2021-12-17 DIAGNOSIS — M7912 Myalgia of auxiliary muscles, head and neck: Secondary | ICD-10-CM | POA: Diagnosis not present

## 2021-12-17 DIAGNOSIS — M5383 Other specified dorsopathies, cervicothoracic region: Secondary | ICD-10-CM | POA: Diagnosis not present

## 2021-12-17 DIAGNOSIS — M9901 Segmental and somatic dysfunction of cervical region: Secondary | ICD-10-CM | POA: Diagnosis not present

## 2022-01-07 DIAGNOSIS — M7912 Myalgia of auxiliary muscles, head and neck: Secondary | ICD-10-CM | POA: Diagnosis not present

## 2022-01-07 DIAGNOSIS — M5383 Other specified dorsopathies, cervicothoracic region: Secondary | ICD-10-CM | POA: Diagnosis not present

## 2022-01-07 DIAGNOSIS — M9901 Segmental and somatic dysfunction of cervical region: Secondary | ICD-10-CM | POA: Diagnosis not present

## 2022-02-11 DIAGNOSIS — G43909 Migraine, unspecified, not intractable, without status migrainosus: Secondary | ICD-10-CM | POA: Diagnosis not present

## 2022-03-11 DIAGNOSIS — M5383 Other specified dorsopathies, cervicothoracic region: Secondary | ICD-10-CM | POA: Diagnosis not present

## 2022-03-11 DIAGNOSIS — M9901 Segmental and somatic dysfunction of cervical region: Secondary | ICD-10-CM | POA: Diagnosis not present

## 2022-03-11 DIAGNOSIS — M7912 Myalgia of auxiliary muscles, head and neck: Secondary | ICD-10-CM | POA: Diagnosis not present

## 2022-04-01 DIAGNOSIS — M7912 Myalgia of auxiliary muscles, head and neck: Secondary | ICD-10-CM | POA: Diagnosis not present

## 2022-04-01 DIAGNOSIS — M5383 Other specified dorsopathies, cervicothoracic region: Secondary | ICD-10-CM | POA: Diagnosis not present

## 2022-04-01 DIAGNOSIS — M9901 Segmental and somatic dysfunction of cervical region: Secondary | ICD-10-CM | POA: Diagnosis not present

## 2022-04-01 DIAGNOSIS — M9905 Segmental and somatic dysfunction of pelvic region: Secondary | ICD-10-CM | POA: Diagnosis not present

## 2022-04-03 DIAGNOSIS — Q7962 Hypermobile Ehlers-Danlos syndrome: Secondary | ICD-10-CM | POA: Diagnosis not present

## 2022-04-03 DIAGNOSIS — M329 Systemic lupus erythematosus, unspecified: Secondary | ICD-10-CM | POA: Diagnosis not present

## 2022-04-03 DIAGNOSIS — Z6830 Body mass index (BMI) 30.0-30.9, adult: Secondary | ICD-10-CM | POA: Diagnosis not present

## 2022-04-03 DIAGNOSIS — G90A Postural orthostatic tachycardia syndrome (POTS): Secondary | ICD-10-CM | POA: Diagnosis not present

## 2022-04-22 DIAGNOSIS — M7912 Myalgia of auxiliary muscles, head and neck: Secondary | ICD-10-CM | POA: Diagnosis not present

## 2022-04-22 DIAGNOSIS — M9905 Segmental and somatic dysfunction of pelvic region: Secondary | ICD-10-CM | POA: Diagnosis not present

## 2022-04-22 DIAGNOSIS — M9901 Segmental and somatic dysfunction of cervical region: Secondary | ICD-10-CM | POA: Diagnosis not present

## 2022-04-22 DIAGNOSIS — M5383 Other specified dorsopathies, cervicothoracic region: Secondary | ICD-10-CM | POA: Diagnosis not present

## 2022-04-29 DIAGNOSIS — M9901 Segmental and somatic dysfunction of cervical region: Secondary | ICD-10-CM | POA: Diagnosis not present

## 2022-04-29 DIAGNOSIS — M5383 Other specified dorsopathies, cervicothoracic region: Secondary | ICD-10-CM | POA: Diagnosis not present

## 2022-04-29 DIAGNOSIS — M7912 Myalgia of auxiliary muscles, head and neck: Secondary | ICD-10-CM | POA: Diagnosis not present

## 2022-04-29 DIAGNOSIS — M9905 Segmental and somatic dysfunction of pelvic region: Secondary | ICD-10-CM | POA: Diagnosis not present

## 2022-05-08 DIAGNOSIS — G4719 Other hypersomnia: Secondary | ICD-10-CM | POA: Diagnosis not present

## 2022-05-21 ENCOUNTER — Other Ambulatory Visit: Payer: Self-pay

## 2022-05-21 DIAGNOSIS — Q796 Ehlers-Danlos syndrome, unspecified: Secondary | ICD-10-CM | POA: Diagnosis not present

## 2022-05-21 DIAGNOSIS — G90A Postural orthostatic tachycardia syndrome (POTS): Secondary | ICD-10-CM | POA: Diagnosis not present

## 2022-05-21 DIAGNOSIS — E278 Other specified disorders of adrenal gland: Secondary | ICD-10-CM | POA: Diagnosis not present

## 2022-05-21 DIAGNOSIS — E039 Hypothyroidism, unspecified: Secondary | ICD-10-CM

## 2022-05-21 DIAGNOSIS — M9905 Segmental and somatic dysfunction of pelvic region: Secondary | ICD-10-CM | POA: Diagnosis not present

## 2022-05-21 DIAGNOSIS — M5383 Other specified dorsopathies, cervicothoracic region: Secondary | ICD-10-CM | POA: Diagnosis not present

## 2022-05-21 DIAGNOSIS — M7912 Myalgia of auxiliary muscles, head and neck: Secondary | ICD-10-CM | POA: Diagnosis not present

## 2022-05-21 DIAGNOSIS — M9901 Segmental and somatic dysfunction of cervical region: Secondary | ICD-10-CM | POA: Diagnosis not present

## 2022-05-22 ENCOUNTER — Other Ambulatory Visit: Payer: Self-pay | Admitting: Endocrinology

## 2022-05-22 DIAGNOSIS — E039 Hypothyroidism, unspecified: Secondary | ICD-10-CM

## 2022-06-02 ENCOUNTER — Other Ambulatory Visit: Payer: BC Managed Care – PPO

## 2022-06-02 DIAGNOSIS — M9901 Segmental and somatic dysfunction of cervical region: Secondary | ICD-10-CM | POA: Diagnosis not present

## 2022-06-02 DIAGNOSIS — M9905 Segmental and somatic dysfunction of pelvic region: Secondary | ICD-10-CM | POA: Diagnosis not present

## 2022-06-02 DIAGNOSIS — M7912 Myalgia of auxiliary muscles, head and neck: Secondary | ICD-10-CM | POA: Diagnosis not present

## 2022-06-02 DIAGNOSIS — M5383 Other specified dorsopathies, cervicothoracic region: Secondary | ICD-10-CM | POA: Diagnosis not present

## 2022-06-17 ENCOUNTER — Ambulatory Visit
Admission: RE | Admit: 2022-06-17 | Discharge: 2022-06-17 | Disposition: A | Discharge status: Home / Self Care | Payer: BC Managed Care – PPO | Source: Ambulatory Visit | Attending: Endocrinology | Admitting: Endocrinology

## 2022-06-17 DIAGNOSIS — E039 Hypothyroidism, unspecified: Secondary | ICD-10-CM

## 2022-06-19 DIAGNOSIS — N926 Irregular menstruation, unspecified: Secondary | ICD-10-CM | POA: Diagnosis not present

## 2022-06-19 DIAGNOSIS — E278 Other specified disorders of adrenal gland: Secondary | ICD-10-CM | POA: Diagnosis not present

## 2022-06-19 DIAGNOSIS — E039 Hypothyroidism, unspecified: Secondary | ICD-10-CM | POA: Diagnosis not present

## 2022-06-19 DIAGNOSIS — R768 Other specified abnormal immunological findings in serum: Secondary | ICD-10-CM | POA: Diagnosis not present

## 2022-06-24 DIAGNOSIS — M7912 Myalgia of auxiliary muscles, head and neck: Secondary | ICD-10-CM | POA: Diagnosis not present

## 2022-06-24 DIAGNOSIS — M9901 Segmental and somatic dysfunction of cervical region: Secondary | ICD-10-CM | POA: Diagnosis not present

## 2022-06-24 DIAGNOSIS — M9905 Segmental and somatic dysfunction of pelvic region: Secondary | ICD-10-CM | POA: Diagnosis not present

## 2022-06-24 DIAGNOSIS — M5383 Other specified dorsopathies, cervicothoracic region: Secondary | ICD-10-CM | POA: Diagnosis not present

## 2022-07-17 DIAGNOSIS — M9905 Segmental and somatic dysfunction of pelvic region: Secondary | ICD-10-CM | POA: Diagnosis not present

## 2022-07-17 DIAGNOSIS — M9901 Segmental and somatic dysfunction of cervical region: Secondary | ICD-10-CM | POA: Diagnosis not present

## 2022-07-17 DIAGNOSIS — M7912 Myalgia of auxiliary muscles, head and neck: Secondary | ICD-10-CM | POA: Diagnosis not present

## 2022-07-17 DIAGNOSIS — M5383 Other specified dorsopathies, cervicothoracic region: Secondary | ICD-10-CM | POA: Diagnosis not present

## 2022-08-07 DIAGNOSIS — M5383 Other specified dorsopathies, cervicothoracic region: Secondary | ICD-10-CM | POA: Diagnosis not present

## 2022-08-07 DIAGNOSIS — M9905 Segmental and somatic dysfunction of pelvic region: Secondary | ICD-10-CM | POA: Diagnosis not present

## 2022-08-07 DIAGNOSIS — M9901 Segmental and somatic dysfunction of cervical region: Secondary | ICD-10-CM | POA: Diagnosis not present

## 2022-08-07 DIAGNOSIS — M7912 Myalgia of auxiliary muscles, head and neck: Secondary | ICD-10-CM | POA: Diagnosis not present

## 2022-08-20 DIAGNOSIS — M9901 Segmental and somatic dysfunction of cervical region: Secondary | ICD-10-CM | POA: Diagnosis not present

## 2022-08-20 DIAGNOSIS — M7912 Myalgia of auxiliary muscles, head and neck: Secondary | ICD-10-CM | POA: Diagnosis not present

## 2022-08-20 DIAGNOSIS — M9905 Segmental and somatic dysfunction of pelvic region: Secondary | ICD-10-CM | POA: Diagnosis not present

## 2022-08-20 DIAGNOSIS — M5383 Other specified dorsopathies, cervicothoracic region: Secondary | ICD-10-CM | POA: Diagnosis not present

## 2022-09-11 DIAGNOSIS — M7912 Myalgia of auxiliary muscles, head and neck: Secondary | ICD-10-CM | POA: Diagnosis not present

## 2022-09-11 DIAGNOSIS — M5383 Other specified dorsopathies, cervicothoracic region: Secondary | ICD-10-CM | POA: Diagnosis not present

## 2022-09-11 DIAGNOSIS — M9901 Segmental and somatic dysfunction of cervical region: Secondary | ICD-10-CM | POA: Diagnosis not present

## 2022-09-11 DIAGNOSIS — M9905 Segmental and somatic dysfunction of pelvic region: Secondary | ICD-10-CM | POA: Diagnosis not present

## 2022-10-01 DIAGNOSIS — K224 Dyskinesia of esophagus: Secondary | ICD-10-CM | POA: Diagnosis not present

## 2022-10-01 DIAGNOSIS — R1314 Dysphagia, pharyngoesophageal phase: Secondary | ICD-10-CM | POA: Diagnosis not present

## 2022-10-02 DIAGNOSIS — M9905 Segmental and somatic dysfunction of pelvic region: Secondary | ICD-10-CM | POA: Diagnosis not present

## 2022-10-02 DIAGNOSIS — M5383 Other specified dorsopathies, cervicothoracic region: Secondary | ICD-10-CM | POA: Diagnosis not present

## 2022-10-02 DIAGNOSIS — M7912 Myalgia of auxiliary muscles, head and neck: Secondary | ICD-10-CM | POA: Diagnosis not present

## 2022-10-02 DIAGNOSIS — M9901 Segmental and somatic dysfunction of cervical region: Secondary | ICD-10-CM | POA: Diagnosis not present

## 2022-10-09 DIAGNOSIS — Z882 Allergy status to sulfonamides status: Secondary | ICD-10-CM | POA: Diagnosis not present

## 2022-10-09 DIAGNOSIS — G40909 Epilepsy, unspecified, not intractable, without status epilepticus: Secondary | ICD-10-CM | POA: Diagnosis not present

## 2022-10-09 DIAGNOSIS — R1310 Dysphagia, unspecified: Secondary | ICD-10-CM | POA: Diagnosis not present

## 2022-10-09 DIAGNOSIS — K224 Dyskinesia of esophagus: Secondary | ICD-10-CM | POA: Diagnosis not present

## 2022-10-09 DIAGNOSIS — E039 Hypothyroidism, unspecified: Secondary | ICD-10-CM | POA: Diagnosis not present

## 2022-10-09 DIAGNOSIS — E785 Hyperlipidemia, unspecified: Secondary | ICD-10-CM | POA: Diagnosis not present

## 2022-10-16 DIAGNOSIS — G43909 Migraine, unspecified, not intractable, without status migrainosus: Secondary | ICD-10-CM | POA: Diagnosis not present

## 2022-11-12 DIAGNOSIS — G90A Postural orthostatic tachycardia syndrome (POTS): Secondary | ICD-10-CM | POA: Diagnosis not present

## 2022-11-12 DIAGNOSIS — G4710 Hypersomnia, unspecified: Secondary | ICD-10-CM | POA: Diagnosis not present

## 2022-11-25 DIAGNOSIS — M329 Systemic lupus erythematosus, unspecified: Secondary | ICD-10-CM | POA: Diagnosis not present

## 2022-11-25 DIAGNOSIS — Z6830 Body mass index (BMI) 30.0-30.9, adult: Secondary | ICD-10-CM | POA: Diagnosis not present

## 2022-12-22 DIAGNOSIS — R1314 Dysphagia, pharyngoesophageal phase: Secondary | ICD-10-CM | POA: Diagnosis not present

## 2022-12-22 DIAGNOSIS — K224 Dyskinesia of esophagus: Secondary | ICD-10-CM | POA: Diagnosis not present

## 2022-12-25 DIAGNOSIS — K224 Dyskinesia of esophagus: Secondary | ICD-10-CM | POA: Diagnosis not present

## 2023-01-13 DIAGNOSIS — G90A Postural orthostatic tachycardia syndrome (POTS): Secondary | ICD-10-CM | POA: Diagnosis not present

## 2023-01-13 DIAGNOSIS — K3184 Gastroparesis: Secondary | ICD-10-CM | POA: Diagnosis not present

## 2023-01-23 DIAGNOSIS — F319 Bipolar disorder, unspecified: Secondary | ICD-10-CM | POA: Diagnosis not present

## 2023-01-23 DIAGNOSIS — Z7951 Long term (current) use of inhaled steroids: Secondary | ICD-10-CM | POA: Diagnosis not present

## 2023-01-23 DIAGNOSIS — M3500 Sicca syndrome, unspecified: Secondary | ICD-10-CM | POA: Diagnosis not present

## 2023-01-23 DIAGNOSIS — K224 Dyskinesia of esophagus: Secondary | ICD-10-CM | POA: Diagnosis not present

## 2023-01-23 DIAGNOSIS — Z79899 Other long term (current) drug therapy: Secondary | ICD-10-CM | POA: Diagnosis not present

## 2023-01-23 DIAGNOSIS — E079 Disorder of thyroid, unspecified: Secondary | ICD-10-CM | POA: Diagnosis not present

## 2023-01-23 DIAGNOSIS — R1310 Dysphagia, unspecified: Secondary | ICD-10-CM | POA: Diagnosis not present

## 2023-01-23 DIAGNOSIS — K3184 Gastroparesis: Secondary | ICD-10-CM | POA: Diagnosis not present

## 2023-01-23 DIAGNOSIS — R140 Abdominal distension (gaseous): Secondary | ICD-10-CM | POA: Diagnosis not present

## 2023-01-23 DIAGNOSIS — G7100 Muscular dystrophy, unspecified: Secondary | ICD-10-CM | POA: Diagnosis not present

## 2023-01-23 DIAGNOSIS — K220 Achalasia of cardia: Secondary | ICD-10-CM | POA: Diagnosis not present

## 2023-01-23 DIAGNOSIS — G90A Postural orthostatic tachycardia syndrome (POTS): Secondary | ICD-10-CM | POA: Diagnosis not present

## 2023-01-23 DIAGNOSIS — F32A Depression, unspecified: Secondary | ICD-10-CM | POA: Diagnosis not present

## 2023-01-23 DIAGNOSIS — G40909 Epilepsy, unspecified, not intractable, without status epilepticus: Secondary | ICD-10-CM | POA: Diagnosis not present

## 2023-01-23 DIAGNOSIS — J45909 Unspecified asthma, uncomplicated: Secondary | ICD-10-CM | POA: Diagnosis not present

## 2023-01-24 DIAGNOSIS — F319 Bipolar disorder, unspecified: Secondary | ICD-10-CM | POA: Diagnosis not present

## 2023-01-24 DIAGNOSIS — K224 Dyskinesia of esophagus: Secondary | ICD-10-CM | POA: Diagnosis not present

## 2023-01-24 DIAGNOSIS — M3500 Sicca syndrome, unspecified: Secondary | ICD-10-CM | POA: Diagnosis not present

## 2023-01-24 DIAGNOSIS — Z79899 Other long term (current) drug therapy: Secondary | ICD-10-CM | POA: Diagnosis not present

## 2023-01-24 DIAGNOSIS — K3184 Gastroparesis: Secondary | ICD-10-CM | POA: Diagnosis not present

## 2023-01-24 DIAGNOSIS — K220 Achalasia of cardia: Secondary | ICD-10-CM | POA: Diagnosis not present

## 2023-01-24 DIAGNOSIS — J45909 Unspecified asthma, uncomplicated: Secondary | ICD-10-CM | POA: Diagnosis not present

## 2023-01-24 DIAGNOSIS — G40909 Epilepsy, unspecified, not intractable, without status epilepticus: Secondary | ICD-10-CM | POA: Diagnosis not present

## 2023-01-24 DIAGNOSIS — E079 Disorder of thyroid, unspecified: Secondary | ICD-10-CM | POA: Diagnosis not present

## 2023-01-24 DIAGNOSIS — G7100 Muscular dystrophy, unspecified: Secondary | ICD-10-CM | POA: Diagnosis not present

## 2023-01-24 DIAGNOSIS — Z7951 Long term (current) use of inhaled steroids: Secondary | ICD-10-CM | POA: Diagnosis not present

## 2023-01-24 NOTE — Discharge Summary (Signed)
 Physician Discharge Summary [facility] 7 BT [facility] [address] [place] [address] Dept: [phone] Loc: [phone]   Identifying Information:  Desiree Evans Mar 03, 1980 [id number]  Primary Care Physician: Ina Marcellus RAMAN, MD  Code Status: Full Code  Admit Date: 01/23/2023  Discharge Date: 01/24/2023   Discharge To: Home  Discharge Service: [facility]   Discharge Attending Physician: Comer Almetta Hammersmith, MD  Discharge Diagnoses: Principal Problem (Resolved):   Achalasia (POA: Unknown) Active Problems:   Prophylactic antibiotic (POA: Not Applicable)   Outpatient Provider Follow Up Issues:  [ ]  Ensure GI follow up   Hospital Course:   Desiree Evans is a 42 y.o. female with PMH EDS, POTS, Sjogrens, bipolar disorder, constipation, muscular dystrophy, gastroparesis and jackhammer esophagus who presented to [facility] for scheduled POEM for therapy of jackhammer esophagus. Procedure completed 12/13 without complications. Of note, she did have a syncopal episode without fall or injury that was attributed to dehydration and her hx of POTS. She received fluids and extra midodrine (home medication) without further event. She tolerated a soft diet on day of discharge and was eager to return home. Strict return precautions provided.   Jackhammer esophagus s/p POEM - Prophylactic antibiotics for a total of 5 days - PPI taken BID x 2 weeks then q day dosing of PPI for 3 months before stopping - Patient should remain on a mechanical soft diet that is meat free for 10 days. UVA easy to chew and swallow diet is a good guide and was provided to her - Follow-up in advanced endoscopy clinic should be scheduled approximately 2 weeks post-op, requested for 12/24 with Dr Audrey  All other chronic conditions stable and unchanged.  Procedures: PR TRANSORAL LOWER ESOPHAGEAL MYOTOMY [43497] (Lower esophageal myotomy, transoral (ie, peroral endoscopic myotomy  (POEM))) ______________________________________________________________________ Discharge Medications:   Your Medication List     PAUSE taking these medications    nebivolol 2.5 MG tablet Wait to take this until your doctor or other care provider tells you to start again. Commonly known as: BYSTOLIC Take 1 tablet (2.5 mg total) by mouth.       START taking these medications    acetaminophen  325 MG tablet Commonly known as: TYLENOL  Take 2 tablets (650 mg total) by mouth every six (6) hours as needed for pain.   levoFLOXacin 750 MG tablet Commonly known as: LEVAQUIN Take 1 tablet (750 mg total) by mouth daily for 4 days. Take daily at 2pm.   pantoprazole  40 MG tablet Commonly known as: PROTONIX  Take 1 tablet (40 mg total) by mouth Two (2) times a day (30 minutes before a meal) for 14 days, THEN 1 tablet (40 mg total) daily before breakfast. Start taking on: January 24, 2023   polyethylene glycol 17 gram packet Commonly known as: MIRALAX Take 17 g by mouth daily as needed (constipation).   senna 8.6 mg tablet Commonly known as: SENOKOT Take 2 tablets by mouth nightly as needed for constipation.       CONTINUE taking these medications    cetirizine 10 MG tablet Commonly known as: ZYRTEC Take by mouth.   cholecalciferol (vitamin D3 25 mcg (1,000 units)) 1,000 unit (25 mcg) tablet Take 5 tablets (125 mcg total) by mouth.   DULERA 100-5 mcg/actuation Hfaa Generic drug: mometasone-formoterol Inhale.   fluticasone propionate 50 mcg/actuation nasal spray Commonly known as: FLONASE 2 sprays.   galcanezumab-gnlm 120 mg/mL injection Commonly known as: EMGALITY Inject 120 mg under the skin every thirty (30) days.  methylphenidate HCl 10 MG tablet Commonly known as: RITALIN Take 1 tablet (10 mg total) by mouth daily. As needed in addition to morning dose   methylphenidate HCl 20 MG ER tablet Commonly known as: METADATE ER Take 1 tablet (20 mg total) by mouth  every morning.   midodrine 10 MG tablet Commonly known as: PROAMATINE Take 1 tablet (10 mg total) by mouth daily as needed (BP).   albuterol 90 mcg/actuation inhaler Commonly known as: PROVENTIL HFA;VENTOLIN HFA Inhale 2 puffs every six (6) hours as needed.   PROAIR RESPICLICK 90 mcg/actuation Aepb Generic drug: albuterol sulfate inhale ONE PUFF EVERY 4 TO 6 HOURS AS NEEDED   albuterol 2.5 mg /3 mL (0.083 %) nebulizer solution Inhale 3 mL (2.5 mg total).   pyridostigmine 60 mg tablet Commonly known as: MESTINON Take 2 tablets (120 mg total) by mouth Three (3) times a day.   UBRELVY 50 mg tablet Generic drug: ubrogepant Take 1 tablet (50 mg total) by mouth daily as needed (migraines).   venlafaxine 150 MG 24 hr capsule Commonly known as: EFFEXOR-XR Take 1 capsule (150 mg total) by mouth daily.        Allergies: Latex, Salmon oil, Hydroxychloroquine, Shrimp, Avocado (laurus persea), Banana, Imitrex [sumatriptan], Premarin [conjugated estrogens], Progesterone, Ziprasidone hcl, and Sulfa (sulfonamide antibiotics) ______________________________________________________________________ Pending Test Results (if blank, then none):   Most Recent Labs: All lab results last 24 hours -  Recent Results (from the past 24 hours)  Basic metabolic panel   Collection Time: 01/24/23  7:23 AM  Result Value Ref Range   Sodium 143 135 - 145 mmol/L   Potassium 4.1 3.4 - 4.8 mmol/L   Chloride 106 98 - 107 mmol/L   CO2 27.0 20.0 - 31.0 mmol/L   Anion Gap 10 5 - 14 mmol/L   BUN 9 9 - 23 mg/dL   Creatinine 9.02 9.44 - 1.02 mg/dL   BUN/Creatinine Ratio 9    eGFR CKD-EPI (2021) Female 74 >=60 mL/min/1.24m2   Glucose 99 70 - 179 mg/dL   Calcium 9.1 8.7 - 89.5 mg/dL  CBC   Collection Time: 01/24/23  7:23 AM  Result Value Ref Range   WBC 10.0 3.6 - 11.2 10*9/L   RBC 4.39 3.95 - 5.13 10*12/L   HGB 13.9 11.3 - 14.9 g/dL   HCT 59.8 65.9 - 55.9 %   MCV 91.4 77.6 - 95.7 fL   MCH 31.6 25.9 -  32.4 pg   MCHC 34.6 32.0 - 36.0 g/dL   RDW 87.1 87.7 - 84.7 %   MPV 9.3 6.8 - 10.7 fL   Platelet 239 150 - 450 10*9/L    Relevant Studies/Radiology (if blank, then none): FL Water Soluble Contrast Swallow Result Date: 01/23/2023 EXAM: FL BARIUM SWALLOW SINGLE CONTRAST ACCESSION: [id number] UN REPORT DATE: 01/23/2023 4:39 PM CLINICAL INDICATION: 42 years old with r/o leak post poem  COMPARISON: Outside upper GI study dated 12/25/2022 TECHNIQUE: Single contrast barium swallow performed. A scout KUB was obtained. The patient was given oral contrast to swallow and observed fluoroscopically.  Videofluoroscopy and spot films of the thoracic esophagus were obtained. Fluoroscopy time was 0.1 minutes using a low dose system with pulsed fluoroscopy at 15 frames per second. COMPARISON: Outside fluoroscopy 12/25/2022 FINDINGS: Scout film demonstrates surgical clips overlying the upper thoracic esophagus compatible with recent POEM. Small volume pneumoperitoneum, expected postsurgical finding. Surgical clips over the right upper quadrant of the abdomen. Gaseous distention of loops of bowel in the upper abdomen. Left basilar subsegmental  atelectasis. The patient was given water-soluble contrast to swallow which the patient did without difficulty.  Fluoroscopy and films of the thoracic esophagus were unremarkable.  No extravasation was noted.  No gross obstructing, constricting or fixed polypoid lesions were noted.  Small contrast opacified outpouching along the medial aspect of the proximal stomach, incompletely evaluated but similar to prior upper GI study.    Limited study with no evidence of extravasation from thoracic esophagus noted.   Unlisted Diagnostic Gastroenterolgy Proc Result Date: 01/23/2023 _______________________________________________________________________________ Patient Name: Desiree Evans          Procedure Date: 01/23/2023 10:48 AM MRN: [id number]                     Date of Birth:  18-May-1980 Admit Type: Outpatient                Age: 48                                       Gender: Female Note Status: Finalized                Instrument Name: [id number] _______________________________________________________________________________  Procedure:             POEM Indications:           Dysphagia, jackhammer esophagus Providers:             IAN GLENDIA PFEIFFER, MD, SYDNEY LANDRY CUMBER, MD,                        Serge Naegeli, Technician Referring MD:          ROLLO ANN CARLYON, MPAS (Referring MD) Medicines:             General Anesthesia, Zosyn  3.375 g IV Complications:         No immediate complications. _______________________________________________________________________________ Procedure:             Pre-Anesthesia Assessment:                        - Prior to the procedure, a History and Physical was                        performed, and patient medications and allergies were                        reviewed. The patient's tolerance of previous                        anesthesia was also reviewed. The risks and benefits                        of the procedure and the sedation options and risks                        were discussed with the patient. All questions were                        answered, and informed consent was obtained. Prior                        Anticoagulants: The patient has taken  no anticoagulant                        or antiplatelet agents. ASA Grade Assessment: III - A                        patient with severe systemic disease. After reviewing                        the risks and benefits, the patient was deemed in                        satisfactory condition to undergo the procedure.                        After obtaining informed consent, the endoscope was                        passed under direct vision. Throughout the procedure,                        the patient's blood pressure, pulse, and oxygen                        saturations were monitored  continuously. The Endoscope                        was introduced through the mouth, and advanced to the                        second part of duodenum. Due to perofrming the                        procedure in OR, technical capabilities to obtain                        photographs was not available.                                                                                Findings:      Abnormal motility was noted in the esophagus. The cricopharyngeus was      normal. There is spasticity of the esophageal body. The distal      esophagus/lower esophageal sphincter is open. Tertiary peristaltic waves      are noted. With the patient in the supine position, a 16 cm long 3 mm      diameter functional lumen imaging probe (FLIP), with a volume-based      barostat bag, was placed at the lower esophageal sphincter using      endoscopic visualization for the pre-intervention and post-intervention      evaluation of esophageal motility disorder. Diagnostic Measurements #1:      Saline was infused into the bag to a volume of 50 mL. FLIP topography      demonstrated contractile activity with spastic and/or reactive features.      The  bag was deflated and the catheter was withdrawn. The gastroscope was      fitted with a clear 4mm tapered ST hood cap. The GEJ was located at 38      cm and was tight. The Endoflip catheter was passed to perform      measurements. The endoscope was advanced through the GEJ. The esophagus      was washed with chlorhexidine . A mucosal bleb was created by injection      of saline and methylene blue into the submucosa. The mucosal incision      was created at 22 cm. A 2 cm incision was made with a Flush knife using      Endocut at 5:00 on the esophageal wall. The submucosal fibers were      dissected with Precise and Spray coagulation and the endoscope was      advanced into the submucosal space. A submucosal tunnel was created      using Precise coagulation and injection of Saline via the  FlushKnife.      When vessels were identified they were treated. The myotomy was      performed from 24 to 44 cm using cutting and coagulation currents,      confirmed by insertion depth. Hemostasis was verified at the end of the      procedure. Following the intervention, repeat assessment was performed      at the same site. Post-intervention Measurements: Saline was infused      into the bag to a volume of 50 mL and demonstrated no spasm. The mucosal      incision was securely closed with 6 hemoclips.      EndoFLIP used for topography/motility assessment. Diameter and DI      recordings not taken.      Total procedure time: 125 mins                                                                                Estimated Blood Loss:      Estimated blood loss was minimal. Impression:            - Abnormal esophageal motility, consistent with                        esophageal spasm. POEM with long myotomy completed                        without difficulty. Closed with 6 clips.                        - FLIP imaging performed.                        - No specimens collected. Recommendation:        - Admit the patient to hospital ward for observation.                        - NPO until swallowing study                        -  If no leak, clear liquid diet today. If tolerated,                        advance to soft diet as tolerated tomorrow morning                        - Analgesia and antiemetics as needed                        - [facility] GI fellow on call for worsening pain, abnormal                        vitals, GI bleeding                        - Discharge on soft diet for 2 weeks                        - Oral antibiotic prophylaxis for 5 days                        - Double dose PPI on discharge for 2 weeks, then                        reduce to daily for 3 months.                        - f/u in GI clinic in 2 weeks virtually with Dr                        Audrey                        - Repeat upper  endoscopy in 1 year for surveillance.                                                                                Procedure Code(s):     --- Professional ---                        [phone], Lower esophageal myotomy, transoral (ie,                        peroral endoscopic myotomy [POEM])                        91040, Esophageal balloon distension study,                        diagnostic, with provocation when performed Diagnosis Code(s):     --- Professional ---                        K22.4, Dyskinesia of esophagus  R13.10, Dysphagia, unspecified CPT copyright 2023 American Medical Association. All rights reserved. The codes documented in this report are preliminary and upon coder review may be revised to meet current compliance requirements. Electronically Signed  by Chauncey RAMAN. Verner, MD ___________________ CHAUNCEY GLENDIA VERNER, MD 01/23/2023 11:07:54 AM The attending physician was present throughout the entire procedure including the insertion, viewing, and removal of the endoscope. This procedure note has been electronically signed by: CHAUNCEY VERNER , MD Electronically Signed By Lacinda Cumber, MD _________________________ LACINDA LANDRY CUMBER, MD 01/23/2023 11:03:45 AM Number of Addenda: 0 Note Initiated On: 01/23/2023 10:48 AM  ______________________________________________________________________ Discharge Instructions:  Activity Instructions     Activity as tolerated        Discharge Instructions:  You were admitted to [facility] for esophageal dysphagia due to jackhammer esophagus. You were treated by doing a POEM procedure. You are now ready to discharge and will be discharging to: Home.  Please carefully read and follow these instructions below upon your discharge. It has been a pleasure taking care of you, and we wish you the best.   1) Please take your medications as prescribed. Major medication changes are listed below, and a full list of medications is in this discharge  packet. At future follow-up appointments, please be sure to take all of your medications with you so your provider can better guide your care.   MEDICATION CHANGES: -- START protonix  and levofloxacin, you may also take over the counter tylenol  as needed if you have pain/discomfort -- You may need miralax/senna, as you are at risk for constipation due to medications you received peri-procedurally. If no BM in within the next 48 hours, please start these over the counter medications  FOLLOW-UP: 2) Seek medical care with your primary care doctor or local Emergency Room or Urgent Care if you develop any changes in your mental status, worsening abdominal pain, fevers greater than 101.5, any unexplained/unrelieved shortness of breath, uncontrolled nausea and vomiting that keeps you from remaining hydrated or taking your medication, or any other concerning symptoms.   3) It is recommended that you see your primary care provider (PCP) after being in the hospital. If you do not see an appointment listed below with your primary care doctor, please call your doctor's office as soon as possible to schedule an appointment to be seen within 7-10 days of discharge. Your PCP is Ina Marcellus RAMAN, MD and the phone number of their office is [phone]. Please call your PCP's office as soon as possible to schedule an appointment with them.   4) Your luminal GI team recommends the following:  - prn zofran  for nausea/vomiting - soft foods diet x 10 days  - continue 5 days of antibiotics - BID proton pump inhibitor (protonix ) x2 weeks then daily for 3 months before stopping   If you have any questions or concerns related to your procedure, please call their clinic line: [facility] [phone]   5) Please go to any follow-up appointments that were already scheduled. Some of your follow-up appointments have been listed in this discharge packet.    Appointments which have been scheduled for you     Feb 03, 2023 11:30 AM (Arrive by 11:15 AM) RETURN VIDEO DIRECT LINK with Lacinda Landry Cumber, MD [facility] [place] Arrive at: This is a Video Visit [address] [facility] 1 through [address] [place] [address] [phone]  A direct link will be sent to you by your  provider at the time of your video appointment. Please do NOT go to the clinic.   For your video visit, you will need a computer with a working camera, speaker and microphone, a smartphone, or a tablet with internet access.        ______________________________________________________________________ Discharge Day Services: BP 98/58   Pulse 64   Temp 36.6 C (97.9 F) (Oral)   Resp 18   Wt 91.4 kg (201 lb 8 oz)   LMP 01/16/2023   SpO2 96%   BMI 31.56 kg/m  Pt seen on the day of discharge and determined appropriate for discharge.  Condition at Discharge: good  Length of Discharge: I spent greater than 30 mins in the discharge of this patient.   Izetta Hammersmith, MD [facility]

## 2023-01-24 NOTE — Consults (Signed)
-------------------------------------------------------------------------------   Attestation signed by Gean Toribio Righter, MD at 01/24/23 1235 I saw and evaluated the patient, participating in the key portions of the service. I reviewed the fellow's note. I agree with the fellow's findings and plan.  Issues Impacting Complexity of Management: -None  Toribio DELENA Gean, MD  -------------------------------------------------------------------------------      Luminal Gastroenterology Consult Service  Initial Consultation     Assessment and Recommendations:  Desiree Evans is a 42 y.o. female with PMH EDS, POTS, Sjogrens, bipolar disorder, constipation, muscular dystrophy, gastroparesis and jackhammer esophagus who presented to [facility] for scheduled POEM for therapy of jackhammer esophagus. The patient is seen in consultation at the request of Comer Almetta Hammersmith, MD ([facility])) for POEM.  Jackhammer esophagus s/p POEM Patient underwent uneventful POEM procedure yesterday.  Postoperative barium swallow without evidence of a leak.  Labs today are reassuring.  Recommend routine care as below and discharge if able to tolerate a diet. - prn zofran   - advance to soft foods, discharge if tolerating - continue 5 days of abx (can be discharged on levaquin) - BID PPI x2 weeks then daily PPI   Issues Impacting Complexity of Management: none  Recommendations discussed with the patient's primary team. We will continue to follow along with you.  History of Present Illness:  Chest sore but otherwise doing well. Drinking liquids went ok   Objective:  Temp:  [36 C (96.8 F)-36.7 C (98.1 F)] 36.6 C (97.9 F) Heart Rate:  [51-72] 64 SpO2 Pulse:  [53-65] 61 Resp:  [12-20] 18 BP: (91-122)/(48-74) 98/58 SpO2:  [95 %-99 %] 96 %  Gen: WDWN female in NAD, answers questions appropriately HEENT: no scleral icterus Neuro: normal speech   Pertinent Labs/Studies: Reviewed labs from 12/14  which show normal WBC and Hgb. Normal renal function.   Imaging 01/23/23 Barium swallow  Impression  Limited study with no evidence of extravasation from thoracic esophagus noted.   Procedures 12/13 EGD Impression:            - Abnormal esophageal motility, consistent with                         esophageal spasm. POEM with long myotomy completed                         without difficulty. Closed with 6 clips.                        - FLIP imaging performed.

## 2023-02-03 DIAGNOSIS — K224 Dyskinesia of esophagus: Secondary | ICD-10-CM | POA: Diagnosis not present

## 2023-02-27 DIAGNOSIS — N3281 Overactive bladder: Secondary | ICD-10-CM | POA: Diagnosis not present

## 2023-02-27 DIAGNOSIS — R300 Dysuria: Secondary | ICD-10-CM | POA: Diagnosis not present

## 2023-02-27 DIAGNOSIS — N200 Calculus of kidney: Secondary | ICD-10-CM | POA: Diagnosis not present

## 2023-04-23 DIAGNOSIS — G43909 Migraine, unspecified, not intractable, without status migrainosus: Secondary | ICD-10-CM | POA: Diagnosis not present

## 2023-05-15 DIAGNOSIS — Z3202 Encounter for pregnancy test, result negative: Secondary | ICD-10-CM | POA: Diagnosis not present

## 2023-05-15 DIAGNOSIS — N3001 Acute cystitis with hematuria: Secondary | ICD-10-CM | POA: Diagnosis not present

## 2023-05-18 DIAGNOSIS — G4719 Other hypersomnia: Secondary | ICD-10-CM | POA: Diagnosis not present

## 2023-05-18 DIAGNOSIS — G4710 Hypersomnia, unspecified: Secondary | ICD-10-CM | POA: Diagnosis not present

## 2023-09-22 DIAGNOSIS — N924 Excessive bleeding in the premenopausal period: Secondary | ICD-10-CM | POA: Diagnosis not present

## 2023-09-22 DIAGNOSIS — Z6832 Body mass index (BMI) 32.0-32.9, adult: Secondary | ICD-10-CM | POA: Diagnosis not present

## 2023-09-22 DIAGNOSIS — E039 Hypothyroidism, unspecified: Secondary | ICD-10-CM | POA: Diagnosis not present

## 2023-09-22 DIAGNOSIS — Z131 Encounter for screening for diabetes mellitus: Secondary | ICD-10-CM | POA: Diagnosis not present

## 2023-10-20 DIAGNOSIS — N939 Abnormal uterine and vaginal bleeding, unspecified: Secondary | ICD-10-CM | POA: Diagnosis not present

## 2023-12-03 DIAGNOSIS — N939 Abnormal uterine and vaginal bleeding, unspecified: Secondary | ICD-10-CM | POA: Diagnosis not present

## 2024-01-11 DIAGNOSIS — G4710 Hypersomnia, unspecified: Secondary | ICD-10-CM | POA: Diagnosis not present

## 2024-01-13 DIAGNOSIS — K224 Dyskinesia of esophagus: Secondary | ICD-10-CM | POA: Diagnosis not present

## 2024-01-21 ENCOUNTER — Ambulatory Visit: Admit: 2024-01-21 | Admitting: Obstetrics and Gynecology

## 2024-01-21 DIAGNOSIS — N939 Abnormal uterine and vaginal bleeding, unspecified: Secondary | ICD-10-CM

## 2024-01-21 SURGERY — HYSTERECTOMY, TOTAL, LAPAROSCOPIC, WITH SALPINGECTOMY
Anesthesia: Choice | Laterality: Bilateral
